# Patient Record
Sex: Male | Born: 1975 | Race: White | Hispanic: No | Marital: Single | Smoking: Current some day smoker
Health system: Southern US, Community
[De-identification: ages and names within clinical notes are randomized; demographics above are authoritative.]

## PROBLEM LIST (undated history)

## (undated) DIAGNOSIS — G4730 Sleep apnea, unspecified: Secondary | ICD-10-CM

---

## 2004-09-29 ENCOUNTER — Ambulatory Visit (HOSPITAL_BASED_OUTPATIENT_CLINIC_OR_DEPARTMENT_OTHER): Admission: RE | Admit: 2004-09-29 | Discharge: 2004-09-29 | Payer: Self-pay | Admitting: Chiropractic Medicine

## 2008-07-20 ENCOUNTER — Emergency Department (HOSPITAL_COMMUNITY): Admission: EM | Admit: 2008-07-20 | Discharge: 2008-07-20 | Payer: Self-pay | Admitting: Emergency Medicine

## 2010-09-25 ENCOUNTER — Encounter: Admission: RE | Admit: 2010-09-25 | Discharge: 2010-09-25 | Payer: Self-pay | Admitting: Geriatric Medicine

## 2010-11-12 ENCOUNTER — Ambulatory Visit (HOSPITAL_BASED_OUTPATIENT_CLINIC_OR_DEPARTMENT_OTHER)
Admission: RE | Admit: 2010-11-12 | Discharge: 2010-11-12 | Payer: Self-pay | Source: Home / Self Care | Attending: Family Medicine | Admitting: Family Medicine

## 2011-03-26 NOTE — Procedures (Signed)
NAME:  Willie Reid, Willie Reid                ACCOUNT NO.:  [id number]   MEDICAL RECORD NO.:  [id number]          PATIENT TYPE:  OUT   LOCATION:  SLEEP CENTER                 FACILITY:  [facility]   PHYSICIAN:  Clinton D. Maple Hudson, M.D. DATE OF BIRTH:  1976-10-04   DATE OF STUDY:  09/29/2004                              NOCTURNAL POLYSOMNOGRAM   REFERRING PHYSICIAN:  Dr. Albina Billet.   INDICATIONS FOR STUDY:  Hypersomnia with sleep apnea.  Epworth Sleepiness  Score of 16/24.  BMI 34.  Weight 230 pounds.   SLEEP ARCHITECTURE:  Total sleep time 412 minutes with sleep efficiency 92%.   STAGE I:  3%.   STAGE II:  52%.   STAGE III:  26%.   STAGE IV:  26%.   REM:  19% of total sleep time.   Sleep latency was 27 minutes.  REM latency was 128 minutes.  Awake after  sleep onset 7 minutes.  Arousable index mildly increased at 28.4.   RESPIRATORY DATA:  Split study protocol:  RDI 26.4 per hour, indicating  moderate obstructive sleep apnea/hypopnea syndrome before CPAP.  This  included one obstructive apnea, two central apneas, and 62 hypopneas before  CPAP.  He slept only supine until the very end of the study, and all of the  events were while supine.  REM RDI was 13.2.  CPAP was titrated to 9 CWP,  which was somewhat uncomfortable and not well tolerated.  The technician  then switched to bilateral/BIPAP using an inspiratory pressure of 13 and  expiratory pressure of 9 CWP, RDI 0 per hour with a medium Respironics full-  phase comfort mask with heated humidifier.  This was well tolerated.   OXYGEN DATA:  Moderate-to-loud snoring before CPAP with oxygen desaturation  to a nadir of 76%.  After BiPAP control, oxygen saturation held 97-98% on  room air.   CARDIAC DATA:  Normal sinus rhythm.   MOVEMENT/PARASOMNIA:  A total of 85 limb jerks were recorded, of which the  above were associated with arousal or awakening for a periodic limb movement  with an arousal index of 1.7 per hour, which is not likely  to be  significant.  There were no bathroom trips.   IMPRESSION/RECOMMENDATIONS:  Moderate obstructive sleep apnea/hypopnea  syndrome with Respiratory Disturbance Index of 26.4 per hour with  desaturation to 76%.  Titration to BiPAP I-13/E-9 CWP, RDI 0 per hour using  a medium Respironics full face comfort mask with heated humidifier.                                                           Clinton D. Maple Hudson, M.D.  Diplomate, [employer] Board   CDY/MEDQ  D:  10/04/2004 15:23:46  T:  10/04/2004 15:52:10  Job:  [id number]

## 2011-08-11 LAB — GC/CHLAMYDIA PROBE AMP, GENITAL
Chlamydia, DNA Probe: NEGATIVE
GC Probe Amp, Genital: NEGATIVE

## 2011-08-11 LAB — URINALYSIS, ROUTINE W REFLEX MICROSCOPIC
Bilirubin Urine: NEGATIVE
Nitrite: NEGATIVE
Specific Gravity, Urine: 1.023
pH: 7.5

## 2011-08-11 LAB — RPR: RPR Ser Ql: NONREACTIVE

## 2011-09-11 ENCOUNTER — Emergency Department (HOSPITAL_COMMUNITY): Payer: No Typology Code available for payment source

## 2011-09-11 ENCOUNTER — Inpatient Hospital Stay (HOSPITAL_COMMUNITY)
Admission: EM | Admit: 2011-09-11 | Discharge: 2011-09-23 | DRG: 956 | Disposition: A | Discharge status: Home / Self Care | Payer: No Typology Code available for payment source | Attending: General Surgery | Admitting: General Surgery

## 2011-09-11 ENCOUNTER — Other Ambulatory Visit: Payer: Self-pay

## 2011-09-11 DIAGNOSIS — S32009A Unspecified fracture of unspecified lumbar vertebra, initial encounter for closed fracture: Secondary | ICD-10-CM

## 2011-09-11 DIAGNOSIS — Y92410 Unspecified street and highway as the place of occurrence of the external cause: Secondary | ICD-10-CM

## 2011-09-11 DIAGNOSIS — S060X9A Concussion with loss of consciousness of unspecified duration, initial encounter: Secondary | ICD-10-CM | POA: Diagnosis present

## 2011-09-11 DIAGNOSIS — S72309A Unspecified fracture of shaft of unspecified femur, initial encounter for closed fracture: Principal | ICD-10-CM | POA: Diagnosis present

## 2011-09-11 DIAGNOSIS — S36039A Unspecified laceration of spleen, initial encounter: Secondary | ICD-10-CM

## 2011-09-11 DIAGNOSIS — Y998 Other external cause status: Secondary | ICD-10-CM

## 2011-09-11 DIAGNOSIS — E669 Obesity, unspecified: Secondary | ICD-10-CM | POA: Diagnosis present

## 2011-09-11 DIAGNOSIS — S7290XA Unspecified fracture of unspecified femur, initial encounter for closed fracture: Secondary | ICD-10-CM

## 2011-09-11 DIAGNOSIS — S32011A Stable burst fracture of first lumbar vertebra, initial encounter for closed fracture: Secondary | ICD-10-CM | POA: Diagnosis present

## 2011-09-11 DIAGNOSIS — F329 Major depressive disorder, single episode, unspecified: Secondary | ICD-10-CM | POA: Diagnosis present

## 2011-09-11 DIAGNOSIS — Z9081 Acquired absence of spleen: Secondary | ICD-10-CM

## 2011-09-11 DIAGNOSIS — S7292XA Unspecified fracture of left femur, initial encounter for closed fracture: Secondary | ICD-10-CM | POA: Diagnosis present

## 2011-09-11 DIAGNOSIS — F3289 Other specified depressive episodes: Secondary | ICD-10-CM | POA: Diagnosis present

## 2011-09-11 DIAGNOSIS — Z6836 Body mass index (BMI) 36.0-36.9, adult: Secondary | ICD-10-CM

## 2011-09-11 DIAGNOSIS — S3600XA Unspecified injury of spleen, initial encounter: Secondary | ICD-10-CM | POA: Diagnosis present

## 2011-09-11 DIAGNOSIS — K560 Paralytic ileus: Secondary | ICD-10-CM | POA: Diagnosis present

## 2011-09-11 DIAGNOSIS — R197 Diarrhea, unspecified: Secondary | ICD-10-CM | POA: Diagnosis not present

## 2011-09-11 DIAGNOSIS — N390 Urinary tract infection, site not specified: Secondary | ICD-10-CM | POA: Diagnosis not present

## 2011-09-11 DIAGNOSIS — R17 Unspecified jaundice: Secondary | ICD-10-CM | POA: Diagnosis not present

## 2011-09-11 DIAGNOSIS — Y838 Other surgical procedures as the cause of abnormal reaction of the patient, or of later complication, without mention of misadventure at the time of the procedure: Secondary | ICD-10-CM | POA: Diagnosis not present

## 2011-09-11 DIAGNOSIS — K929 Disease of digestive system, unspecified: Secondary | ICD-10-CM | POA: Diagnosis not present

## 2011-09-11 DIAGNOSIS — S27329A Contusion of lung, unspecified, initial encounter: Secondary | ICD-10-CM | POA: Diagnosis present

## 2011-09-11 DIAGNOSIS — S3681XA Injury of peritoneum, initial encounter: Secondary | ICD-10-CM | POA: Diagnosis present

## 2011-09-11 HISTORY — DX: Sleep apnea, unspecified: G47.30

## 2011-09-11 LAB — COMPREHENSIVE METABOLIC PANEL
Albumin: 3.7 g/dL (ref 3.5–5.2)
BUN: 13 mg/dL (ref 6–23)
Calcium: 8.7 mg/dL (ref 8.4–10.5)
Creatinine, Ser: 1.26 mg/dL (ref 0.50–1.35)
GFR calc Af Amer: 84 mL/min — ABNORMAL LOW (ref >90)
Total Protein: 6.4 g/dL (ref 6.0–8.3)

## 2011-09-11 LAB — URINALYSIS, ROUTINE W REFLEX MICROSCOPIC
Bilirubin Urine: NEGATIVE
Ketones, ur: NEGATIVE mg/dL
Leukocytes, UA: NEGATIVE
Nitrite: NEGATIVE
Protein, ur: 100 mg/dL — AB
Urobilinogen, UA: 0.2 mg/dL (ref 0.0–1.0)
pH: 7.5 (ref 5.0–8.0)

## 2011-09-11 LAB — RAPID URINE DRUG SCREEN, HOSP PERFORMED
Barbiturates: NOT DETECTED
Benzodiazepines: NOT DETECTED
Cocaine: NOT DETECTED
Opiates: NOT DETECTED
Tetrahydrocannabinol: POSITIVE — AB

## 2011-09-11 LAB — CBC
MCH: 31 pg (ref 26.0–34.0)
MCHC: 35.9 g/dL (ref 30.0–36.0)
Platelets: 185 10*3/uL (ref 150–400)
RBC: 4.97 MIL/uL (ref 4.22–5.81)
RDW: 14 % (ref 11.5–15.5)

## 2011-09-11 LAB — POCT I-STAT, CHEM 8
Calcium, Ion: 1.08 mmol/L — ABNORMAL LOW (ref 1.12–1.32)
Creatinine, Ser: 1.4 mg/dL — ABNORMAL HIGH (ref 0.50–1.35)
Glucose, Bld: 135 mg/dL — ABNORMAL HIGH (ref 70–99)
Hemoglobin: 16 g/dL (ref 13.0–17.0)
Sodium: 139 mEq/L (ref 135–145)
TCO2: 20 mmol/L (ref 0–100)

## 2011-09-11 LAB — URINE MICROSCOPIC-ADD ON

## 2011-09-11 LAB — PROTIME-INR: Prothrombin Time: 16.2 seconds — ABNORMAL HIGH (ref 11.6–15.2)

## 2011-09-11 MED ORDER — DEXTROSE-NACL 5-0.9 % IV SOLN
INTRAVENOUS | Status: DC
  Administered 2011-09-12 – 2011-09-16 (×10): via INTRAVENOUS
  Administered 2011-09-17: 990 mL via INTRAVENOUS

## 2011-09-11 MED ORDER — IOHEXOL 300 MG/ML  SOLN
100.0000 mL | Freq: Once | INTRAMUSCULAR | Status: AC | PRN
  Administered 2011-09-11: 100 mL via INTRAVENOUS

## 2011-09-11 MED ORDER — ONDANSETRON HCL 4 MG/2ML IJ SOLN: 4.0000 mg | Freq: Four times a day (QID) | INTRAMUSCULAR | Status: DC | PRN

## 2011-09-11 MED ORDER — PANTOPRAZOLE SODIUM 40 MG IV SOLR
40.0000 mg | Freq: Every day | INTRAVENOUS | Status: DC
  Administered 2011-09-12 – 2011-09-15 (×3): 40 mg via INTRAVENOUS
  Filled 2011-09-11 (×4): qty 40

## 2011-09-12 ENCOUNTER — Inpatient Hospital Stay (HOSPITAL_COMMUNITY): Payer: No Typology Code available for payment source | Admitting: Anesthesiology

## 2011-09-12 ENCOUNTER — Encounter (HOSPITAL_COMMUNITY): Payer: Self-pay | Admitting: *Deleted

## 2011-09-12 ENCOUNTER — Inpatient Hospital Stay (HOSPITAL_COMMUNITY): Payer: No Typology Code available for payment source

## 2011-09-12 ENCOUNTER — Encounter (HOSPITAL_COMMUNITY): Payer: Self-pay | Admitting: Anesthesiology

## 2011-09-12 ENCOUNTER — Encounter (HOSPITAL_COMMUNITY): Admission: EM | Disposition: A | Discharge status: Home / Self Care | Payer: Self-pay | Source: Home / Self Care

## 2011-09-12 ENCOUNTER — Other Ambulatory Visit (INDEPENDENT_AMBULATORY_CARE_PROVIDER_SITE_OTHER): Payer: Self-pay | Admitting: General Surgery

## 2011-09-12 DIAGNOSIS — S060X9A Concussion with loss of consciousness of unspecified duration, initial encounter: Secondary | ICD-10-CM | POA: Diagnosis present

## 2011-09-12 DIAGNOSIS — S7292XA Unspecified fracture of left femur, initial encounter for closed fracture: Secondary | ICD-10-CM | POA: Diagnosis present

## 2011-09-12 DIAGNOSIS — S32011A Stable burst fracture of first lumbar vertebra, initial encounter for closed fracture: Secondary | ICD-10-CM | POA: Diagnosis present

## 2011-09-12 DIAGNOSIS — S36039A Unspecified laceration of spleen, initial encounter: Secondary | ICD-10-CM | POA: Diagnosis present

## 2011-09-12 DIAGNOSIS — J9600 Acute respiratory failure, unspecified whether with hypoxia or hypercapnia: Secondary | ICD-10-CM

## 2011-09-12 HISTORY — PX: SPLENECTOMY, TOTAL: SHX788

## 2011-09-12 HISTORY — PX: LAPAROTOMY: SHX154

## 2011-09-12 LAB — CBC
Hemoglobin: 13.9 g/dL (ref 13.0–17.0)
MCH: 30.1 pg (ref 26.0–34.0)
Platelets: 103 10*3/uL — ABNORMAL LOW (ref 150–400)
RBC: 4.62 MIL/uL (ref 4.22–5.81)
WBC: 8.5 10*3/uL (ref 4.0–10.5)

## 2011-09-12 LAB — COMPREHENSIVE METABOLIC PANEL
ALT: 64 U/L — ABNORMAL HIGH (ref 0–53)
AST: 160 U/L — ABNORMAL HIGH (ref 0–37)
Alkaline Phosphatase: 30 U/L — ABNORMAL LOW (ref 39–117)
CO2: 21 mEq/L (ref 19–32)
Calcium: 6.5 mg/dL — ABNORMAL LOW (ref 8.4–10.5)
GFR calc non Af Amer: >90 mL/min (ref >90)
Potassium: 3.9 mEq/L (ref 3.5–5.1)
Sodium: 136 mEq/L (ref 135–145)

## 2011-09-12 LAB — ABO/RH: ABO/RH(D): O POS

## 2011-09-12 LAB — HEMOGLOBIN AND HEMATOCRIT, BLOOD
HCT: 32.8 % — ABNORMAL LOW (ref 39.0–52.0)
Hemoglobin: 11.4 g/dL — ABNORMAL LOW (ref 13.0–17.0)

## 2011-09-12 LAB — MRSA PCR SCREENING: MRSA by PCR: NEGATIVE

## 2011-09-12 SURGERY — LAPAROTOMY, EXPLORATORY
Anesthesia: General | Site: Abdomen | Wound class: Clean

## 2011-09-12 MED ORDER — MORPHINE SULFATE 2 MG/ML IJ SOLN
2.0000 mg | Freq: Once | INTRAMUSCULAR | Status: AC
  Administered 2011-09-11: 2 mg via INTRAVENOUS

## 2011-09-12 MED ORDER — HYDROMORPHONE 0.3 MG/ML IV SOLN: INTRAVENOUS | Status: DC

## 2011-09-12 MED ORDER — POVIDONE-IODINE 10 % EX OINT
TOPICAL_OINTMENT | CUTANEOUS | Status: DC | PRN
  Administered 2011-09-12: 1 via TOPICAL

## 2011-09-12 MED ORDER — CEFAZOLIN SODIUM 1-5 GM-% IV SOLN
1.0000 g | Freq: Three times a day (TID) | INTRAVENOUS | Status: AC
Start: 2011-09-12 — End: 2011-09-13
  Administered 2011-09-12 (×2): 1 g via INTRAVENOUS
  Filled 2011-09-12 (×3): qty 50

## 2011-09-12 MED ORDER — DIPHENHYDRAMINE HCL 50 MG/ML IJ SOLN: 12.5000 mg | Freq: Four times a day (QID) | INTRAMUSCULAR | Status: DC | PRN

## 2011-09-12 MED ORDER — DEXAMETHASONE SODIUM PHOSPHATE 4 MG/ML IJ SOLN
INTRAMUSCULAR | Status: DC | PRN
  Administered 2011-09-12: 4 mg via INTRAVENOUS

## 2011-09-12 MED ORDER — GLYCOPYRROLATE 0.2 MG/ML IJ SOLN
INTRAMUSCULAR | Status: DC | PRN
  Administered 2011-09-12: 0.1 mg via INTRAVENOUS
  Administered 2011-09-12: .06 mg via INTRAVENOUS

## 2011-09-12 MED ORDER — CEFAZOLIN SODIUM 1-5 GM-% IV SOLN
INTRAVENOUS | Status: DC | PRN
  Administered 2011-09-12: 2 g via INTRAVENOUS

## 2011-09-12 MED ORDER — PROMETHAZINE HCL 25 MG/ML IJ SOLN: 6.2500 mg | INTRAMUSCULAR | Status: DC | PRN

## 2011-09-12 MED ORDER — SUCCINYLCHOLINE CHLORIDE 20 MG/ML IJ SOLN
INTRAMUSCULAR | Status: DC | PRN
  Administered 2011-09-12: 140 mg via INTRAVENOUS

## 2011-09-12 MED ORDER — WHITE PETROLATUM GEL
Status: AC
  Administered 2011-09-12: 21:00:00
  Filled 2011-09-12: qty 5

## 2011-09-12 MED ORDER — CEFAZOLIN SODIUM 1 G IJ SOLR
2.0000 g | INTRAMUSCULAR | Status: DC
  Filled 2011-09-12: qty 20

## 2011-09-12 MED ORDER — ONDANSETRON HCL 4 MG/2ML IJ SOLN
INTRAMUSCULAR | Status: DC | PRN
  Administered 2011-09-12: 4 mg via INTRAVENOUS

## 2011-09-12 MED ORDER — HYDROMORPHONE HCL PF 1 MG/ML IJ SOLN: 0.2500 mg | INTRAMUSCULAR | Status: DC | PRN

## 2011-09-12 MED ORDER — LACTATED RINGERS IV SOLN
INTRAVENOUS | Status: DC | PRN
  Administered 2011-09-12: 11:00:00 via INTRAVENOUS

## 2011-09-12 MED ORDER — NALOXONE HCL 0.4 MG/ML IJ SOLN: 0.4000 mg | INTRAMUSCULAR | Status: DC | PRN

## 2011-09-12 MED ORDER — LORAZEPAM 2 MG/ML IJ SOLN: 1.0000 mg | Freq: Once | INTRAMUSCULAR | Status: DC | PRN

## 2011-09-12 MED ORDER — BIOTENE DRY MOUTH MT LIQD
15.0000 mL | Freq: Two times a day (BID) | OROMUCOSAL | Status: DC
  Administered 2011-09-13 – 2011-09-16 (×8): 15 mL via OROMUCOSAL

## 2011-09-12 MED ORDER — NEOSTIGMINE METHYLSULFATE 1 MG/ML IJ SOLN
INTRAMUSCULAR | Status: DC | PRN
  Administered 2011-09-12: 4 mg via INTRAVENOUS

## 2011-09-12 MED ORDER — DIPHENHYDRAMINE HCL 12.5 MG/5ML PO ELIX
12.5000 mg | ORAL_SOLUTION | Freq: Four times a day (QID) | ORAL | Status: DC | PRN
  Filled 2011-09-12: qty 5

## 2011-09-12 MED ORDER — LIDOCAINE HCL (CARDIAC) 20 MG/ML IV SOLN
INTRAVENOUS | Status: DC | PRN
  Administered 2011-09-12: 50 mg via INTRAVENOUS

## 2011-09-12 MED ORDER — MORPHINE SULFATE 4 MG/ML IJ SOLN
INTRAMUSCULAR | Status: AC
  Filled 2011-09-12: qty 1

## 2011-09-12 MED ORDER — PROPOFOL 10 MG/ML IV EMUL
INTRAVENOUS | Status: DC | PRN
  Administered 2011-09-12: 150 mg via INTRAVENOUS

## 2011-09-12 MED ORDER — ONDANSETRON HCL 4 MG/2ML IJ SOLN: 4.0000 mg | Freq: Four times a day (QID) | INTRAMUSCULAR | Status: DC | PRN

## 2011-09-12 MED ORDER — HETASTARCH-ELECTROLYTES 6 % IV SOLN
INTRAVENOUS | Status: DC | PRN
  Administered 2011-09-12: 11:00:00 via INTRAVENOUS

## 2011-09-12 MED ORDER — VECURONIUM BROMIDE 10 MG IV SOLR
INTRAVENOUS | Status: DC | PRN
  Administered 2011-09-12: 5 mg via INTRAVENOUS
  Administered 2011-09-12: 1 mg via INTRAVENOUS

## 2011-09-12 MED ORDER — FENTANYL CITRATE 0.05 MG/ML IJ SOLN
INTRAMUSCULAR | Status: DC | PRN
  Administered 2011-09-12 (×2): 100 ug via INTRAVENOUS
  Administered 2011-09-12 (×4): 50 ug via INTRAVENOUS

## 2011-09-12 MED ORDER — SODIUM CHLORIDE 0.9 % IR SOLN
Status: DC | PRN
  Administered 2011-09-12: 6000 mL

## 2011-09-12 MED ORDER — MORPHINE SULFATE 2 MG/ML IJ SOLN: 1.0000 mg | INTRAMUSCULAR | Status: DC | PRN

## 2011-09-12 MED ORDER — CHLORHEXIDINE GLUCONATE 0.12 % MT SOLN
15.0000 mL | Freq: Two times a day (BID) | OROMUCOSAL | Status: DC
  Administered 2011-09-12 – 2011-09-17 (×9): 15 mL via OROMUCOSAL
  Filled 2011-09-12 (×16): qty 15

## 2011-09-12 MED ORDER — SODIUM CHLORIDE 0.9 % IJ SOLN: 9.0000 mL | INTRAMUSCULAR | Status: DC | PRN

## 2011-09-12 MED ORDER — ONDANSETRON HCL 4 MG/2ML IJ SOLN
4.0000 mg | Freq: Four times a day (QID) | INTRAMUSCULAR | Status: DC | PRN
  Administered 2011-09-15 – 2011-09-18 (×3): 4 mg via INTRAVENOUS
  Filled 2011-09-12: qty 2

## 2011-09-12 MED ORDER — SODIUM CHLORIDE 0.9 % IV SOLN
Freq: Once | INTRAVENOUS | Status: AC
  Administered 2011-09-12: 01:00:00 via INTRAVENOUS

## 2011-09-12 MED ORDER — IOHEXOL 300 MG/ML  SOLN
80.0000 mL | Freq: Once | INTRAMUSCULAR | Status: AC | PRN
  Administered 2011-09-12: 80 mL via INTRAVENOUS

## 2011-09-12 MED ORDER — MEPERIDINE HCL 25 MG/ML IJ SOLN: 6.2500 mg | INTRAMUSCULAR | Status: DC | PRN

## 2011-09-12 SURGICAL SUPPLY — 39 items
BLADE SURG ROTATE 9660 (MISCELLANEOUS) IMPLANT
CANISTER SUCTION 2500CC (MISCELLANEOUS) IMPLANT
CHLORAPREP W/TINT 26ML (MISCELLANEOUS) ×3 IMPLANT
CLOTH BEACON ORANGE TIMEOUT ST (SAFETY) ×3 IMPLANT
COVER SURGICAL LIGHT HANDLE (MISCELLANEOUS) ×6 IMPLANT
DRAPE LAPAROSCOPIC ABDOMINAL (DRAPES) ×3 IMPLANT
DRAPE UTILITY 15X26 W/TAPE STR (DRAPE) ×6 IMPLANT
DRAPE WARM FLUID 44X44 (DRAPE) ×3 IMPLANT
ELECT BLADE 6.5 EXT (BLADE) IMPLANT
ELECT REM PT RETURN 9FT ADLT (ELECTROSURGICAL) ×3
ELECTRODE REM PT RTRN 9FT ADLT (ELECTROSURGICAL) ×2 IMPLANT
GAUZE SPONGE 4X4 12PLY STRL LF (GAUZE/BANDAGES/DRESSINGS) ×3 IMPLANT
GLOVE BIOGEL PI IND STRL 8 (GLOVE) ×4 IMPLANT
GLOVE BIOGEL PI INDICATOR 8 (GLOVE) ×2
GLOVE ECLIPSE 7.5 STRL STRAW (GLOVE) ×3 IMPLANT
GOWN STRL NON-REIN LRG LVL3 (GOWN DISPOSABLE) ×9 IMPLANT
KIT BASIN OR (CUSTOM PROCEDURE TRAY) ×3 IMPLANT
KIT ROOM TURNOVER OR (KITS) ×3 IMPLANT
LIGASURE IMPACT 36 18CM CVD LR (INSTRUMENTS) ×3 IMPLANT
NS IRRIG 1000ML POUR BTL (IV SOLUTION) ×18 IMPLANT
PACK GENERAL/GYN (CUSTOM PROCEDURE TRAY) ×3 IMPLANT
PAD ARMBOARD 7.5X6 YLW CONV (MISCELLANEOUS) ×3 IMPLANT
SPECIMEN JAR LARGE (MISCELLANEOUS) ×3 IMPLANT
SPECIMEN JAR X LARGE (MISCELLANEOUS) IMPLANT
SPONGE GAUZE 4X4 12PLY (GAUZE/BANDAGES/DRESSINGS) ×3 IMPLANT
SPONGE LAP 18X18 X RAY DECT (DISPOSABLE) ×6 IMPLANT
STAPLER VISISTAT 35W (STAPLE) ×3 IMPLANT
SUCTION POOLE TIP (SUCTIONS) ×3 IMPLANT
SUT PDS AB 1 TP1 96 (SUTURE) ×6 IMPLANT
SUT SILK 2 0 SH CR/8 (SUTURE) ×3 IMPLANT
SUT SILK 2 0 TIES 10X30 (SUTURE) ×3 IMPLANT
SUT SILK 3 0 SH CR/8 (SUTURE) ×3 IMPLANT
SUT SILK 3 0 TIES 10X30 (SUTURE) ×3 IMPLANT
TAPE CLOTH SURG 4X10 WHT LF (GAUZE/BANDAGES/DRESSINGS) ×3 IMPLANT
TOWEL OR 17X24 6PK STRL BLUE (TOWEL DISPOSABLE) ×3 IMPLANT
TOWEL OR 17X26 10 PK STRL BLUE (TOWEL DISPOSABLE) ×3 IMPLANT
TRAY FOLEY CATH 14FRSI W/METER (CATHETERS) IMPLANT
WATER STERILE IRR 1000ML POUR (IV SOLUTION) ×3 IMPLANT
YANKAUER SUCT BULB TIP NO VENT (SUCTIONS) ×3 IMPLANT

## 2011-09-12 NOTE — Anesthesia Procedure Notes (Addendum)
Procedure Name: Intubation Date/Time: 09/12/2011 10:47 AM Performed by: Elizbeth Squires Patient Re-evaluated:Patient Re-evaluated prior to inductionOxygen Delivery Method: Circle System Utilized Preoxygenation: Pre-oxygenation with 100% oxygen Intubation Type: IV induction, Rapid sequence and Circoid Pressure applied Laryngoscope Size: Mac and 3 Grade View: Grade I Tube size: 8.0 mm Number of attempts: 1 Airway Equipment and Method: stylet Placement Confirmation: ETT inserted through vocal cords under direct vision,  positive ETCO2 and breath sounds checked- equal and bilateral Secured at: 23 cm Tube secured with: Tape Dental Injury: Teeth and Oropharynx as per pre-operative assessment  Comments: Lacerated area noted right tongue, some blood in back of airway with DVL

## 2011-09-12 NOTE — Progress Notes (Signed)
    Subjective: Pain is mild.  No c/o chest pain or SOB.   Still confused/sleepy.  No new pain complaints. Comfortable in skeletal traction.  Has no received 4 units pRBCs.    Objective: Vital signs in last 24 hours: Temp:  [98 F (36.7 C)-98.5 F (36.9 C)] 98 F (36.7 C) (11/04 0545) Pulse Rate:  [91-101] 98  (11/04 0600) Resp:  [19-25] 25  (11/04 0600) BP: (80-109)/(33-63) 104/49 mmHg (11/04 0600) SpO2:  [98 %-100 %] 99 % (11/04 0600) Weight:  [104.8 kg (231 lb 0.7 oz)] 231 lb 0.7 oz (104.8 kg) (11/03 2300)  Intake/Output from previous day: 11/03 0701 - 11/04 0700 In: 1467.5 [I.V.:1117.5; Blood:350] Out: 1125 [Urine:1125] Intake/Output this shift: Total I/O In: 1467.5 [I.V.:1117.5; Blood:350] Out: 1125 [Urine:1125]   Basename 09/12/11 0119 09/11/11 2036 09/11/11 2024  HGB 11.4* 16.0 15.4    Basename 09/12/11 0119 09/11/11 2036 09/11/11 2024  WBC -- -- 16.0*  RBC -- -- 4.97  HCT 32.8* 47.0 --  PLT -- -- 185    Basename 09/11/11 2036 09/11/11 2024  NA 139 137  K 3.7 3.6  CL 106 104  CO2 -- 22  BUN 13 13  CREATININE 1.40* 1.26  GLUCOSE 135* 137*  CALCIUM -- 8.7    Basename 09/11/11 2024  LABPT --  INR 1.27    Sensation intact distally LLE Intact pulses distally LLE Dorsiflexion/Plantar flexion intact L foot Compartment soft L thigh  Assessment/Plan:   Will d/w primary team re: timing of fixation of L femur fx.  Once cleared for OR by neurosurgery and trauma surgery, will take for IMN  L femur.  NPO. Cont bedrest and skeletal traction.     Willie Reid 09/12/2011, 6:44 AM
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
## 2011-09-12 NOTE — Anesthesia Postprocedure Evaluation (Signed)
  Anesthesia Post-op Note  Patient: Willie Reid  Procedure(s) Performed:  EXPLORATORY LAPAROTOMY; SPLENECTOMY  Patient Location: PACU  Anesthesia Type: General  Level of Consciousness: confused and responds to stimulation  Airway and Oxygen Therapy: Patient Spontanous Breathing and Patient connected to face mask oxygen  Post-op Pain: none  Post-op Assessment: Post-op Vital signs reviewed, Patient's Cardiovascular Status Stable, Respiratory Function Stable, Patent Airway, No signs of Nausea or vomiting and Pain level controlled  Post-op Vital Signs: stable  Complications: No apparent anesthesia complications

## 2011-09-12 NOTE — Progress Notes (Signed)
  Operative report  Preoperative diagnoses:  Hemoperitoneum with hypotension and grade 2 splenic laceration  Postoperative diagnosis:  Same with an additional mesenteric laceration  Procedure:  #1 splenectomy; #2 repair of mesenteric laceration  Surgeon:  Abbe Amsterdam  Anesthesia:  Gen. Endotracheal  Estimated blood loss:  1600 cc  Specimen:  Spleen  Complications:  None  Condition:  Fair, stable  Disposition:  To PACU then to ICU  Fluids:  1000 cc of Hextend, 1800 cc of lactated Ringer's, 850 cc of Cell Saver blood  Urine output:  150 cc  Marta Lamas. Gae Bon, MD, FACS [phone] [facility]
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
## 2011-09-12 NOTE — Anesthesia Preprocedure Evaluation (Addendum)
Anesthesia Evaluation  Patient identified by MRN, date of birth, ID band Patient confused    Reviewed: Allergy & Precautions, H&P , NPO status , Patient's Chart, lab work & pertinent test results  Airway Mallampati: II TM Distance: >3 FB     Dental   Pulmonary  clear to auscultation        Cardiovascular Regular Tachycardia    Neuro/Psych PSYCHIATRIC DISORDERS Anxiety Depression    GI/Hepatic   Endo/Other    Renal/GU      Musculoskeletal   Abdominal (+) obese,   Peds  Hematology   Anesthesia Other Findings   Reproductive/Obstetrics                         Anesthesia Physical Anesthesia Plan  ASA: II and Emergent  Anesthesia Plan: General   Post-op Pain Management:    Induction: Intravenous, Rapid sequence and Cricoid pressure planned  Airway Management Planned: Oral ETT  Additional Equipment:   Intra-op Plan:   Post-operative Plan: Extubation in OR  Informed Consent: I have reviewed the patients History and Physical, chart, labs and discussed the procedure including the risks, benefits and alternatives for the proposed anesthesia with the patient or authorized representative who has indicated his/her understanding and acceptance.   History available from chart only and Dental advisory given  Plan Discussed with: CRNA, Surgeon and Anesthesiologist  Anesthesia Plan Comments:       Anesthesia Quick Evaluation
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
## 2011-09-12 NOTE — Progress Notes (Signed)
The patient has continuing bleeding from his spleen.  Extravasation seen on initial CT note officially called by radiology.  Will go to OR today/ASAP

## 2011-09-12 NOTE — Transfer of Care (Signed)
Immediate Anesthesia Transfer of Care Note  Patient: Willie Reid   Procedure(s) Performed:  EXPLORATORY LAPAROTOMY; SPLENECTOMY  Patient Location: PACU  Anesthesia Type: General  Level of Consciousness: sedated  Airway & Oxygen Therapy: Patient Spontanous Breathing and Patient connected to face mask oxygen  Post-op Assessment: Report given to PACU RN and Post -op Vital signs reviewed and stable  Post vital signs: stable  Complications: No apparent anesthesia complications

## 2011-09-12 NOTE — Op Note (Signed)
Operative report  Preoperative diagnoses:  Hemoperitoneum with Grade II splenic laceration, hypotension  Postoperative diagnosis:  Same with mesenteric laceration  Procedure:  Exploratory laparotomy, splenectomy, repair of mesenteric laceration  Surgeon:  Gabriel Carina  Anesthesia:  Gen. Endotracheal  Estimated blood loss:  1600 cc  Specimen:  Spleen  Complications:  None  Condition:  Fair but stable  Disposition:  To PACU then to 2100 ICU  Fluids:  1800cc crystalloid, 1000cc hextend, 850cc Cellsaver blood  Urine output:  150cc  Marta Lamas. Gae Bon, MD, FACS [phone] [facility]
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
## 2011-09-13 ENCOUNTER — Inpatient Hospital Stay (HOSPITAL_COMMUNITY): Payer: No Typology Code available for payment source | Admitting: Anesthesiology

## 2011-09-13 ENCOUNTER — Encounter (HOSPITAL_COMMUNITY): Admission: EM | Disposition: A | Discharge status: Home / Self Care | Payer: Self-pay | Source: Home / Self Care

## 2011-09-13 ENCOUNTER — Encounter (HOSPITAL_COMMUNITY): Payer: Self-pay | Admitting: Anesthesiology

## 2011-09-13 ENCOUNTER — Inpatient Hospital Stay (HOSPITAL_COMMUNITY): Payer: No Typology Code available for payment source

## 2011-09-13 HISTORY — PX: FEMUR IM NAIL: SHX1597

## 2011-09-13 LAB — CBC
Hemoglobin: 13.6 g/dL (ref 13.0–17.0)
MCH: 30.3 pg (ref 26.0–34.0)
MCHC: 35.9 g/dL (ref 30.0–36.0)
MCV: 84.4 fL (ref 78.0–100.0)
Platelets: 104 10*3/uL — ABNORMAL LOW (ref 150–400)

## 2011-09-13 LAB — BASIC METABOLIC PANEL
BUN: 8 mg/dL (ref 6–23)
Calcium: 7.2 mg/dL — ABNORMAL LOW (ref 8.4–10.5)
GFR calc Af Amer: >90 mL/min (ref >90)
GFR calc non Af Amer: >90 mL/min (ref >90)
Potassium: 3.6 mEq/L (ref 3.5–5.1)

## 2011-09-13 LAB — DIFFERENTIAL
Basophils Relative: 0 % (ref 0–1)
Eosinophils Absolute: 0 10*3/uL (ref 0.0–0.7)
Lymphs Abs: 1.1 10*3/uL (ref 0.7–4.0)
Monocytes Absolute: 1.2 10*3/uL — ABNORMAL HIGH (ref 0.1–1.0)
Neutrophils Relative %: 83 % — ABNORMAL HIGH (ref 43–77)

## 2011-09-13 SURGERY — INSERTION, INTRAMEDULLARY ROD, FEMUR
Anesthesia: General | Laterality: Left

## 2011-09-13 MED ORDER — PROPOFOL 10 MG/ML IV EMUL
INTRAVENOUS | Status: DC | PRN
  Administered 2011-09-13: 50 mg via INTRAVENOUS
  Administered 2011-09-13: 200 mg via INTRAVENOUS

## 2011-09-13 MED ORDER — SODIUM CHLORIDE 0.9 % IR SOLN
Status: DC | PRN
  Administered 2011-09-13: 1

## 2011-09-13 MED ORDER — NEOSTIGMINE METHYLSULFATE 1 MG/ML IJ SOLN
INTRAMUSCULAR | Status: DC | PRN
  Administered 2011-09-13: 4 mg via INTRAVENOUS

## 2011-09-13 MED ORDER — GLYCOPYRROLATE 0.2 MG/ML IJ SOLN
INTRAMUSCULAR | Status: DC | PRN
  Administered 2011-09-13: .8 mg via INTRAVENOUS

## 2011-09-13 MED ORDER — METOPROLOL TARTRATE 1 MG/ML IV SOLN
5.0000 mg | Freq: Four times a day (QID) | INTRAVENOUS | Status: DC
  Administered 2011-09-13 – 2011-09-21 (×30): 5 mg via INTRAVENOUS
  Filled 2011-09-13 (×35): qty 5

## 2011-09-13 MED ORDER — HYDROMORPHONE HCL PF 1 MG/ML IJ SOLN: 0.2500 mg | INTRAMUSCULAR | Status: DC | PRN

## 2011-09-13 MED ORDER — LABETALOL HCL 5 MG/ML IV SOLN
INTRAVENOUS | Status: DC | PRN
  Administered 2011-09-13 (×2): 10 mg via INTRAVENOUS

## 2011-09-13 MED ORDER — MORPHINE SULFATE 4 MG/ML IJ SOLN
1.0000 mg | INTRAMUSCULAR | Status: DC | PRN
  Administered 2011-09-13 (×2): 2 mg via INTRAVENOUS
  Administered 2011-09-13 – 2011-09-15 (×5): 4 mg via INTRAVENOUS
  Administered 2011-09-16: 1 mg via INTRAVENOUS
  Administered 2011-09-16: 4 mg via INTRAVENOUS
  Administered 2011-09-17: 2 mg via INTRAVENOUS
  Filled 2011-09-13 (×9): qty 1

## 2011-09-13 MED ORDER — CEFAZOLIN SODIUM 1-5 GM-% IV SOLN
1.0000 g | Freq: Four times a day (QID) | INTRAVENOUS | Status: AC
  Administered 2011-09-14 (×2): 1 g via INTRAVENOUS
  Filled 2011-09-13 (×3): qty 50

## 2011-09-13 MED ORDER — FENTANYL CITRATE 0.05 MG/ML IJ SOLN
INTRAMUSCULAR | Status: DC | PRN
  Administered 2011-09-13 (×2): 50 ug via INTRAVENOUS
  Administered 2011-09-13: 150 ug via INTRAVENOUS
  Administered 2011-09-13 (×2): 100 ug via INTRAVENOUS
  Administered 2011-09-13: 150 ug via INTRAVENOUS

## 2011-09-13 MED ORDER — LACTATED RINGERS IV SOLN
INTRAVENOUS | Status: DC | PRN
  Administered 2011-09-13 (×2): via INTRAVENOUS

## 2011-09-13 MED ORDER — CEFAZOLIN SODIUM 1-5 GM-% IV SOLN
INTRAVENOUS | Status: DC | PRN
  Administered 2011-09-13: 1 g via INTRAVENOUS

## 2011-09-13 MED ORDER — ONDANSETRON HCL 4 MG/2ML IJ SOLN
INTRAMUSCULAR | Status: DC | PRN
  Administered 2011-09-13 (×2): 4 mg via INTRAVENOUS

## 2011-09-13 MED ORDER — SODIUM CHLORIDE 0.9 % IJ SOLN
3.0000 mL | Freq: Two times a day (BID) | INTRAMUSCULAR | Status: DC
  Administered 2011-09-13 – 2011-09-16 (×5): 3 mL via INTRAVENOUS

## 2011-09-13 MED ORDER — SUCCINYLCHOLINE CHLORIDE 20 MG/ML IJ SOLN
INTRAMUSCULAR | Status: DC | PRN
  Administered 2011-09-13: 120 mg via INTRAVENOUS

## 2011-09-13 MED ORDER — MORPHINE SULFATE 4 MG/ML IJ SOLN
INTRAMUSCULAR | Status: AC
  Administered 2011-09-13: 2 mg via INTRAVENOUS
  Filled 2011-09-13: qty 18

## 2011-09-13 MED ORDER — MORPHINE SULFATE 2 MG/ML IJ SOLN: 1.0000 mg | INTRAMUSCULAR | Status: DC | PRN

## 2011-09-13 MED ORDER — ROCURONIUM BROMIDE 100 MG/10ML IV SOLN
INTRAVENOUS | Status: DC | PRN
  Administered 2011-09-13: 20 mg via INTRAVENOUS
  Administered 2011-09-13: 50 mg via INTRAVENOUS

## 2011-09-13 MED ORDER — CEFAZOLIN SODIUM 1-5 GM-% IV SOLN
1.0000 g | Freq: Once | INTRAVENOUS | Status: AC
  Administered 2011-09-13: 1 g via INTRAVENOUS
  Filled 2011-09-13: qty 50

## 2011-09-13 MED FILL — Cefazolin in D5W Inj 1 GM/50ML: INTRAVENOUS | Qty: 50 | Status: AC

## 2011-09-13 SURGICAL SUPPLY — 44 items
BANDAGE GAUZE 4  KLING STR (GAUZE/BANDAGES/DRESSINGS) ×2 IMPLANT
BANDAGE GAUZE ELAST BULKY 4 IN (GAUZE/BANDAGES/DRESSINGS) ×2 IMPLANT
BIT DRILL 3.8X8 NS (BIT) ×2 IMPLANT
BIT DRILL 5.3 (BIT) ×2 IMPLANT
BNDG COHESIVE 4X5 TAN STRL (GAUZE/BANDAGES/DRESSINGS) ×2 IMPLANT
CHLORAPREP W/TINT 26ML (MISCELLANEOUS) ×2 IMPLANT
CLOTH BEACON ORANGE TIMEOUT ST (SAFETY) ×2 IMPLANT
COVER MAYO STAND STRL (DRAPES) ×4 IMPLANT
COVER SURGICAL LIGHT HANDLE (MISCELLANEOUS) ×2 IMPLANT
DRAPE C-ARM 42X72 X-RAY (DRAPES) ×2 IMPLANT
DRAPE INCISE IOBAN 66X45 STRL (DRAPES) ×2 IMPLANT
DRAPE STERI IOBAN 125X83 (DRAPES) IMPLANT
DRAPE SURG 17X23 STRL (DRAPES) ×8 IMPLANT
DRSG MEPILEX BORDER 4X4 (GAUZE/BANDAGES/DRESSINGS) ×4 IMPLANT
DRSG MEPILEX BORDER 4X8 (GAUZE/BANDAGES/DRESSINGS) ×2 IMPLANT
DRSG PAD ABDOMINAL 8X10 ST (GAUZE/BANDAGES/DRESSINGS) ×2 IMPLANT
ELECT REM PT RETURN 9FT ADLT (ELECTROSURGICAL) ×2
ELECTRODE REM PT RTRN 9FT ADLT (ELECTROSURGICAL) ×1 IMPLANT
GLOVE BIO SURGEON STRL SZ7.5 (GLOVE) ×4 IMPLANT
GLOVE BIOGEL PI IND STRL 8 (GLOVE) ×2 IMPLANT
GLOVE BIOGEL PI INDICATOR 8 (GLOVE) ×2
GOWN STRL NON-REIN LRG LVL3 (GOWN DISPOSABLE) ×2 IMPLANT
GUIDEPIN 3.2X17.5 THRD DISP (PIN) ×2 IMPLANT
GUIDEWIRE BALL NOSE 80CM (WIRE) ×2 IMPLANT
KIT ROOM TURNOVER OR (KITS) ×2 IMPLANT
NAIL ENTRY TROCH 11X40MM (Nail) ×2 IMPLANT
NS IRRIG 1000ML POUR BTL (IV SOLUTION) ×2 IMPLANT
PACK GENERAL/GYN (CUSTOM PROCEDURE TRAY) ×2 IMPLANT
PAD ARMBOARD 7.5X6 YLW CONV (MISCELLANEOUS) ×4 IMPLANT
SCREW ACE CAP BN (Screw) ×1 IMPLANT
SCREW ACE CORTICAL 6.5X70MML (Screw) ×2 IMPLANT
SCREW ACECAP 50MM (Screw) ×2 IMPLANT
SCREW BN FT 58X4.5XSLD CORT (Screw) ×1 IMPLANT
SCREWDRIVER HEX TIP 3.5MM (MISCELLANEOUS) ×2 IMPLANT
SLEEVE SURGEON STRL (DRAPES) ×2 IMPLANT
SPONGE GAUZE 4X4 FOR O.R. (GAUZE/BANDAGES/DRESSINGS) ×2 IMPLANT
STAPLER VISISTAT 35W (STAPLE) ×2 IMPLANT
SUT VIC AB 1 CT1 27 (SUTURE) ×1
SUT VIC AB 1 CT1 27XBRD ANBCTR (SUTURE) ×1 IMPLANT
SUT VIC AB 2-0 CT1 27 (SUTURE) ×1
SUT VIC AB 2-0 CT1 TAPERPNT 27 (SUTURE) ×1 IMPLANT
TOWEL OR 17X24 6PK STRL BLUE (TOWEL DISPOSABLE) ×2 IMPLANT
TOWEL OR 17X26 10 PK STRL BLUE (TOWEL DISPOSABLE) ×2 IMPLANT
WATER STERILE IRR 1000ML POUR (IV SOLUTION) ×2 IMPLANT

## 2011-09-13 NOTE — Anesthesia Procedure Notes (Addendum)
Procedure Name: Intubation Date/Time: 09/13/2011 5:04 PM Performed by: Fanny Dance Pre-anesthesia Checklist: Patient identified, Emergency Drugs available, Suction available and Patient being monitored Patient Re-evaluated:Patient Re-evaluated prior to inductionOxygen Delivery Method: Circle System Utilized Preoxygenation: Pre-oxygenation with 100% oxygen Intubation Type: IV induction, Rapid sequence and Circoid Pressure applied Laryngoscope Size: Miller and 2 Grade View: Grade II Tube type: Oral Tube size: 7.5 mm Number of attempts: 1 Airway Equipment and Method: stylet Placement Confirmation: ETT inserted through vocal cords under direct vision,  positive ETCO2 and breath sounds checked- equal and bilateral Secured at: 22 cm Tube secured with: Tape

## 2011-09-13 NOTE — Preoperative (Signed)
Beta Blockers   Reason not to administer Beta Blockers:Not Applicable 
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
## 2011-09-13 NOTE — Anesthesia Postprocedure Evaluation (Signed)
  Anesthesia Post-op Note  Patient: Willie Reid  Procedure(s) Performed:  INTRAMEDULLARY (IM) NAIL FEMORAL  Patient Location:ICU  Anesthesia Type: General  Level of Consciousness: sedated  Airway and Oxygen Therapy: Patient connected to face mask oxygen  Post-op Pain: mild  Post-op Assessment: Post-op Vital signs reviewed  Post-op Vital Signs: stable  Complications: No apparent anesthesia complications

## 2011-09-13 NOTE — Transfer of Care (Signed)
Immediate Anesthesia Transfer of Care Note  Patient: Willie Reid  Procedure(s) Performed:  INTRAMEDULLARY (IM) NAIL FEMORAL  Patient Location: PACU and ICU  Anesthesia Type: General  Level of Consciousness: sedated  Airway & Oxygen Therapy: Patient connected to face mask oxygen  Post-op Assessment: Post -op Vital signs reviewed and stable  Post vital signs: stable  Complications: No apparent anesthesia complications
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
## 2011-09-13 NOTE — Progress Notes (Signed)
Pt transferred off unit to OR at 1600. Pt still away at 1900, unable to obtain vital signs and administer schedule medications during that time.

## 2011-09-14 LAB — TYPE AND SCREEN
ABO/RH(D): O POS
Unit division: 0
Unit division: 0
Unit division: 0
Unit division: 0

## 2011-09-14 MED ORDER — INFLUENZA VIRUS VACC SPLIT PF IM SUSP
0.5000 mL | Freq: Once | INTRAMUSCULAR | Status: AC
  Administered 2011-09-14: 0.5 mL via INTRAMUSCULAR
  Filled 2011-09-14: qty 0.5

## 2011-09-14 MED ORDER — CLONAZEPAM 1 MG PO TABS
1.0000 mg | ORAL_TABLET | Freq: Every evening | ORAL | Status: DC | PRN
  Administered 2011-09-14 – 2011-09-16 (×3): 2 mg via ORAL
  Administered 2011-09-18: 1 mg via ORAL
  Filled 2011-09-14 (×2): qty 2
  Filled 2011-09-14: qty 1
  Filled 2011-09-14: qty 2
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
## 2011-09-14 NOTE — Plan of Care (Signed)
Problem: Phase I Progression Outcomes Goal: Other Phase I Outcomes/Goals Plan changed to hip/femur fracture pathway

## 2011-09-14 NOTE — Plan of Care (Signed)
Problem: Phase I Progression Outcomes Goal: Post op clear liquids, advance diet as tolerated Outcome: Progressing Sips only

## 2011-09-14 NOTE — Progress Notes (Signed)
   Subjective: Pain is improving.  No c/o chest pain or SOB.   More alert this am.  No other c/o except back and LLE pain.  No LE N/T.    Objective: Vital signs in last 24 hours: Temp:  [97.3 F (36.3 C)-98.3 F (36.8 C)] 98.3 F (36.8 C) (11/06 0413) Pulse Rate:  [99-127] 99  (11/06 0600) Resp:  [18-25] 18  (11/06 0600) BP: (121-165)/(60-91) 123/60 mmHg (11/06 0600) SpO2:  [91 %-100 %] 98 % (11/06 0600)  Intake/Output from previous day: 11/05 0701 - 11/06 0700 In: 1500 [I.V.:1500] Out: 4130 [Urine:3930; Emesis/NG output:50; Blood:150] Intake/Output this shift:     Basename 09/13/11 1100 09/12/11 1415 09/12/11 0119 09/11/11 2036 09/11/11 2024  HGB 13.6 13.9 11.4* 16.0 15.4    Basename 09/13/11 1100 09/12/11 1415  WBC 13.6* 8.5  RBC 4.49 4.62  HCT 37.9* 39.7  PLT 104* 103*    Basename 09/13/11 1100 09/12/11 1415  NA 133* 136  K 3.6 3.9  CL 105 110  CO2 20 21  BUN 8 14  CREATININE 0.66 0.81  GLUCOSE 119* 130*  CALCIUM 7.2* 6.5*    Basename 09/11/11 2024  LABPT --  INR 1.27    Neurovascular intact Sensation intact distally Intact pulses distally Dorsiflexion/Plantar flexion intact Incision: dressing C/D/I Compartment soft  Assessment/Plan:   Up with therapy when cleared from spine standpoint. Weight Bearing as Tolerated (WBAT) LLE  VTE prophylaxis: regimen to be chosen by surgical team Will follow.    [name] 09/14/2011, 7:10 AM

## 2011-09-15 MED ORDER — PROMETHAZINE HCL 25 MG/ML IJ SOLN
12.5000 mg | INTRAMUSCULAR | Status: DC | PRN
  Administered 2011-09-15: 12.5 mg via INTRAVENOUS
  Filled 2011-09-15: qty 1

## 2011-09-15 MED ORDER — CHLORPROMAZINE HCL 25 MG/ML IJ SOLN
25.0000 mg | Freq: Four times a day (QID) | INTRAMUSCULAR | Status: DC | PRN
  Administered 2011-09-15 – 2011-09-21 (×6): 25 mg via INTRAMUSCULAR
  Filled 2011-09-15 (×6): qty 1

## 2011-09-15 MED FILL — Sodium Chloride IV Soln 0.9%: INTRAVENOUS | Qty: 1000 | Status: AC

## 2011-09-15 MED FILL — Sodium Chloride Irrigation Soln 0.9%: Qty: 3000 | Status: AC

## 2011-09-15 MED FILL — Heparin Sodium (Porcine) Inj 1000 Unit/ML: INTRAMUSCULAR | Qty: 1 | Status: AC

## 2011-09-15 MED FILL — Heparin Sodium (Porcine) Inj 1000 Unit/ML: INTRAMUSCULAR | Qty: 30 | Status: AC
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
## 2011-09-15 NOTE — Progress Notes (Signed)
Subjective: The patient is much more alert and awake today.  Has hiccups.  Just vomited large amount of bilious fluid.  Did not aspirate  Objective: Vital signs in last 24 hours: Temp:  [97.4 F (36.3 C)-98.3 F (36.8 C)] 98 F (36.7 C) (11/07 0606) Pulse Rate:  [91-116] 91  (11/07 0606) Resp:  [17-24] 18  (11/07 0606) BP: (121-147)/(74-87) 121/75 mmHg (11/07 0606) SpO2:  [92 %-96 %] 96 % (11/07 0606) Last BM Date: 09/11/11  Intake/Output from previous day: 11/06 0701 - 11/07 0700 In: 950 [I.V.:900; IV Piggyback:50] Out: 4225 [Urine:4225] Intake/Output this shift:    General: No acute distress except for vomiting.  Hiccups.  Lungs: Clear to auscultation.  Oxygen saturations are good.  Cardiac:  RRR, no murmurs  Abd: Soft, flat, hypoactive bowel sounds  Extremities: Complaining of left calf pain.  Neuro: Intact.  Concussion resolving.  Lab Results: CBC   Basename 09/13/11 1100 09/12/11 1415  WBC 13.6* 8.5  HGB 13.6 13.9  HCT 37.9* 39.7  PLT 104* 103*   BMET  Basename 09/13/11 1100 09/12/11 1415  NA 133* 136  K 3.6 3.9  CL 105 110  CO2 20 21  GLUCOSE 119* 130*  BUN 8 14  CREATININE 0.66 0.81  CALCIUM 7.2* 6.5*   PT/INR No results found for this basename: LABPROT:2,INR:2 in the last 72 hours ABG No results found for this basename: PHART:2,PCO2:2,PO2:2,HCO3:2 in the last 72 hours  Studies/Results: Dg Femur Left  09/13/2011  *RADIOLOGY REPORT*  Clinical Data: Left femur intraoperative fixation  LEFT FEMUR - 2 VIEW  Comparison: 09/11/2011  Findings: Six intraprocedural images demonstrate intramedullary rod fixation of the previously seen mid shaft left femoral fracture. Fracture fragments are in near anatomic alignment.  No evidence for hardware failure.  IMPRESSION: Expected intraoperative appearance after intramedullary rod fixation of previously seen mid shaft left femoral fracture.  Original Report Authenticated By: Harrel Lemon, M.D.   Dg Femur Left  Port  09/13/2011  *RADIOLOGY REPORT*  Clinical Data: Femur fracture  PORTABLE LEFT FEMUR - 2 VIEW  Comparison: 09/11/2011  Findings: An intramedullary rod is seen transfixing a midshaft femur fracture.  A butterfly fragment remains displaced.  To distal interlocking screws.  One proximal interlocking screw.  No breakage or loosening of the hardware.  IMPRESSION: ORIF femur fracture.  Original Report Authenticated By: Donavan Burnet, M.D.   Dg C-arm 61-120 Min  09/13/2011  CLINICAL DATA: orif left femur   C-ARM 61-120 MINUTES  Fluoroscopy was utilized by the requesting physician.  No radiographic  interpretation.      Anti-infectives: Anti-infectives     Start     Dose/Rate Route Frequency Ordered Stop   09/13/11 1630   ceFAZolin (ANCEF) IVPB 1 g/50 mL premix        1 g 100 mL/hr over 30 Minutes Intravenous  Once 09/13/11 1617 09/13/11 2316   09/12/11 1600   ceFAZolin (ANCEF) IVPB 1 g/50 mL premix        1 g 100 mL/hr over 30 Minutes Intravenous Every 8 hours 09/12/11 1535 09/13/11 0022   09/12/11 1015   ceFAZolin (ANCEF) injection 2 g  Status:  Discontinued        2 g Intramuscular On call to O.R. 09/12/11 1001 09/12/11 1535          Assessment/Plan: s/p Procedure(s): INTRAMEDULLARY (IM) NAIL FEMORAL Continue foley due to Continue foley due to Patient is flat on his back and non-ambulatory for 48 hours. Significant hiccups and  nausea.  Has anti-emetics, but nothing for the hiccups.  LOS: 4 days   Marta Lamas. Gae Bon, MD, FACS [phone] Trauma Surgeon 09/15/2011

## 2011-09-15 NOTE — Progress Notes (Signed)
*  PRELIMINARY RESULTS*  Left Lower Extremity Venous Doppler has been performed. No DVT seen in Left lower extremity.    Farrel Demark 09/15/2011, 4:27 PM

## 2011-09-15 NOTE — Progress Notes (Signed)
  PATIENT ID: Willie Reid  MRN: [id number]  DOB/AGE:  1976/01/27 / 35 y.o.  2 Days Post-Op Procedure(s) (LRB): INTRAMEDULLARY (IM) NAIL FEMORAL (Left)  Subjective: Pain is mild.  No c/o chest pain or SOB.   Leg is feeling much better.  No new MSK complaints.     Objective: Vital signs in last 24 hours: Temp:  [97.4 F (36.3 C)-98.3 F (36.8 C)] 98 F (36.7 C) (11/07 0606) Pulse Rate:  [91-116] 91  (11/07 0606) Resp:  [16-24] 18  (11/07 0606) BP: (121-147)/(70-87) 121/75 mmHg (11/07 0606) SpO2:  [92 %-96 %] 96 % (11/07 0606)  Intake/Output from previous day: 11/06 0701 - 11/07 0700 In: 950 [I.V.:900; IV Piggyback:50] Out: 4225 [Urine:4225] Intake/Output this shift:     Basename 09/13/11 1100 09/12/11 1415  HGB 13.6 13.9    Basename 09/13/11 1100 09/12/11 1415  WBC 13.6* 8.5  RBC 4.49 4.62  HCT 37.9* 39.7  PLT 104* 103*    Basename 09/13/11 1100 09/12/11 1415  NA 133* 136  K 3.6 3.9  CL 105 110  CO2 20 21  BUN 8 14  CREATININE 0.66 0.81  GLUCOSE 119* 130*  CALCIUM 7.2* 6.5*   No results found for this basename: LABPT:2,INR:2 in the last 72 hours  Physical Exam: LLE: Neurovascular intact Sensation intact distally Intact pulses distally Dorsiflexion/Plantar flexion intact Incision: no drainage No cellulitis present Compartment soft  Assessment/Plan: 2 Days Post-Op Procedure(s) (LRB): INTRAMEDULLARY (IM) NAIL FEMORAL (Left)   Up with therapy when cleared by neurosurg Weight Bearing as Tolerated (WBAT)  VTE prophylaxis: regimen to be chosen by surgical team Secondary survery, no other MSK complaints.  Will continue to assess. Dry dressing as needed.   Willie Reid 09/15/2011, 8:12 AM

## 2011-09-15 NOTE — Progress Notes (Signed)
CSW completed patient initial assessment - located in shadow chart.  CSW to complete SBIRT at later date.  [address] [place], [place] [phone]

## 2011-09-16 LAB — DIFFERENTIAL
Basophils Relative: 0 % (ref 0–1)
Eosinophils Absolute: 0.2 10*3/uL (ref 0.0–0.7)
Lymphs Abs: 1.3 10*3/uL (ref 0.7–4.0)
Neutro Abs: 14.1 10*3/uL — ABNORMAL HIGH (ref 1.7–7.7)
Neutrophils Relative %: 79 % — ABNORMAL HIGH (ref 43–77)

## 2011-09-16 LAB — BASIC METABOLIC PANEL
Chloride: 105 mEq/L (ref 96–112)
GFR calc Af Amer: >90 mL/min (ref >90)
GFR calc non Af Amer: >90 mL/min (ref >90)
Potassium: 3.6 mEq/L (ref 3.5–5.1)
Sodium: 136 mEq/L (ref 135–145)

## 2011-09-16 LAB — CBC
HCT: 35.4 % — ABNORMAL LOW (ref 39.0–52.0)
MCHC: 33.9 g/dL (ref 30.0–36.0)
RDW: 15.1 % (ref 11.5–15.5)

## 2011-09-16 MED ORDER — PANTOPRAZOLE SODIUM 40 MG PO TBEC
40.0000 mg | DELAYED_RELEASE_TABLET | Freq: Every day | ORAL | Status: DC
  Administered 2011-09-16 – 2011-09-17 (×2): 40 mg via ORAL
  Filled 2011-09-16 (×2): qty 1

## 2011-09-16 MED ORDER — DIPHENOXYLATE-ATROPINE 2.5-0.025 MG PO TABS
1.0000 | ORAL_TABLET | Freq: Four times a day (QID) | ORAL | Status: DC
  Administered 2011-09-16 – 2011-09-17 (×4): 1 via ORAL
  Filled 2011-09-16 (×7): qty 1

## 2011-09-16 NOTE — Progress Notes (Signed)
Subjective: The patient is having a lot of diarrhea.  Wants something to slow it down.  Objective: Vital signs in last 24 hours: Temp:  [97.3 F (36.3 C)-99.3 F (37.4 C)] 97.5 F (36.4 C) (11/08 0900) Pulse Rate:  [84-120] 115  (11/08 0900) Resp:  [16-22] 20  (11/08 0900) BP: (123-147)/(61-81) 135/71 mmHg (11/08 0900) SpO2:  [86 %-98 %] 91 % (11/08 0900) Last BM Date: 09/11/11  Intake/Output from previous day: 11/07 0701 - 11/08 0700 In: 2163 [I.V.:2163] Out: 1100 [Urine:1100] Intake/Output this shift: Total I/O In: -  Out: 452 [Urine:450; Stool:2]  General: Much better mental status wise.  Having diarrhea  Lungs: Clear to auscultation  Abd: Soft, very active BSs  Extremities: No changes  Neuro: Still at bedrest because of back fracture  Lab Results: CBC   Basename 09/16/11 0536  WBC 18.0*  HGB 12.0*  HCT 35.4*  PLT 234   BMET  Basename 09/16/11 0536  NA 136  K 3.6  CL 105  CO2 24  GLUCOSE 110*  BUN 23  CREATININE 0.82  CALCIUM 7.7*   PT/INR No results found for this basename: LABPROT:2,INR:2 in the last 72 hours ABG No results found for this basename: PHART:2,PCO2:2,PO2:2,HCO3:2 in the last 72 hours  Studies/Results: No results found.  Anti-infectives: Anti-infectives     Start     Dose/Rate Route Frequency Ordered Stop   09/13/11 2115   ceFAZolin (ANCEF) IVPB 1 g/50 mL premix        1 g 100 mL/hr over 30 Minutes Intravenous Every 6 hours 09/13/11 2113 09/14/11 1514   09/13/11 1630   ceFAZolin (ANCEF) IVPB 1 g/50 mL premix        1 g 100 mL/hr over 30 Minutes Intravenous  Once 09/13/11 1617 09/13/11 2316   09/12/11 1600   ceFAZolin (ANCEF) IVPB 1 g/50 mL premix        1 g 100 mL/hr over 30 Minutes Intravenous Every 8 hours 09/12/11 1535 09/13/11 0022   09/12/11 1015   ceFAZolin (ANCEF) injection 2 g  Status:  Discontinued        2 g Intramuscular On call to O.R. 09/12/11 1001 09/12/11 1535          Assessment/Plan: s/p  Procedure(s): INTRAMEDULLARY (IM) NAIL FEMORAL Advance diet Get stool for C.D\fiff toxin titer. Control diarrhea Check with Dr. Venetia Maxon to see if patient can come off bedrest (the patient has a L1 burst fracture)  LOS: 5 days   Marta Lamas. Gae Bon, MD, FACS [phone] Trauma Surgeon 09/16/2011

## 2011-09-16 NOTE — Progress Notes (Signed)
CSW met with patient at bedside to complete SBIRT assessment and discuss patient plans at dc.  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  Patient lives alone but has a girlfriend, mother, and grandmother who remain supportive and willing to provide 24 hour support as needed.  Patient agreeable to rehab or HH if necessary at dc.  CSW will continue to follow for emotional support and dc planning as needed.  [address] [place], [place] [phone]

## 2011-09-16 NOTE — Progress Notes (Signed)
  Subjective: Patient reports pain well controlled. No leg weakness or numbness  Objective: Vital signs in last 24 hours: Temp:  [97.5 F (36.4 C)-99.3 F (37.4 C)] 97.5 F (36.4 C) (11/08 0900) Pulse Rate:  [84-120] 110  (11/08 1434) Resp:  [16-22] 18  (11/08 1434) BP: (123-147)/(61-78) 137/78 mmHg (11/08 1434) SpO2:  [86 %-98 %] 96 % (11/08 1434)  Intake/Output from previous day: 11/07 0701 - 11/08 0700 In: 2163 [I.V.:2163] Out: 1100 [Urine:1100] Intake/Output this shift: Total I/O In: -  Out: 453 [Urine:450; Stool:3]  Physical Exam: Full strength both legs, no numbness.  Lab Results:  [facility] [place] [facility] 09/16/11 0536  WBC 18.0*  HGB 12.0*  HCT 35.4*  PLT 234   BMET  Basename 09/16/11 0536  NA 136  K 3.6  CL 105  CO2 24  GLUCOSE 110*  BUN 23  CREATININE 0.82  CALCIUM 7.7*    Studies/Results: No results found.  Assessment/Plan: Mobilize in TLSO.  Have pt fitted.  Must be in TLSO before out of bed.    LOS: 5 days    Dorian Heckle, MD 09/16/2011, 3:49 PM

## 2011-09-16 NOTE — Progress Notes (Signed)
PATIENT ID: Willie Reid  MRN: [id number]  DOB/AGE:  Dec 19, 1975 / 35 y.o.  3 Days Post-Op Procedure(s) (LRB): INTRAMEDULLARY (IM) NAIL FEMORAL (Left)  Subjective: Pain is mild.  No c/o chest pain or SOB.   Eager to get OOB.   Objective: Vital signs in last 24 hours: Temp:  [97.5 F (36.4 C)-99.3 F (37.4 C)] 97.5 F (36.4 C) (11/08 0900) Pulse Rate:  [84-120] 110  (11/08 1434) Resp:  [16-22] 18  (11/08 1434) BP: (123-147)/(61-78) 137/78 mmHg (11/08 1434) SpO2:  [86 %-98 %] 96 % (11/08 1434)  Intake/Output from previous day: 11/07 0701 - 11/08 0700 In: 2163 [I.V.:2163] Out: 1100 [Urine:1100] Intake/Output this shift: Total I/O In: -  Out: 453 [Urine:450; Stool:3]   Basename 09/16/11 0536  HGB 12.0*    Basename 09/16/11 0536  WBC 18.0*  RBC 4.06*  HCT 35.4*  PLT 234    Basename 09/16/11 0536  NA 136  K 3.6  CL 105  CO2 24  BUN 23  CREATININE 0.82  GLUCOSE 110*  CALCIUM 7.7*   No results found for this basename: LABPT:2,INR:2 in the last 72 hours  Physical Exam: LLE: Neurovascular intact Sensation intact distally Intact pulses distally Dorsiflexion/Plantar flexion intact Incision: dressing C/D/I Compartment soft  Assessment/Plan: 3 Days Post-Op Procedure(s) (LRB): INTRAMEDULLARY (IM) NAIL FEMORAL (Left)   Up with therapy tomorrow per Dr. Venetia Maxon. Weight Bearing as Tolerated (WBAT) LLE  VTE prophylaxis: per primary team    [name] 09/16/2011, 3:58 PM

## 2011-09-17 ENCOUNTER — Encounter (HOSPITAL_COMMUNITY): Payer: Self-pay | Admitting: General Surgery

## 2011-09-17 MED ORDER — MORPHINE SULFATE 4 MG/ML IJ SOLN
1.0000 mg | INTRAMUSCULAR | Status: DC | PRN
  Administered 2011-09-17 – 2011-09-19 (×2): 4 mg via INTRAVENOUS
  Administered 2011-09-19 (×2): 2 mg via INTRAVENOUS
  Administered 2011-09-19 – 2011-09-22 (×3): 4 mg via INTRAVENOUS
  Filled 2011-09-17 (×7): qty 1

## 2011-09-17 MED ORDER — OXYCODONE-ACETAMINOPHEN 5-325 MG PO TABS
1.0000 | ORAL_TABLET | ORAL | Status: DC | PRN
  Administered 2011-09-20 – 2011-09-21 (×2): 2 via ORAL
  Filled 2011-09-17 (×2): qty 2

## 2011-09-17 MED ORDER — TIZANIDINE HCL 4 MG PO TABS
4.0000 mg | ORAL_TABLET | Freq: Three times a day (TID) | ORAL | Status: DC | PRN
  Administered 2011-09-18: 4 mg via ORAL
  Filled 2011-09-17: qty 1

## 2011-09-17 MED ORDER — DEXTROSE IN LACTATED RINGERS 5 % IV SOLN
INTRAVENOUS | Status: DC
  Administered 2011-09-17 – 2011-09-20 (×6): via INTRAVENOUS

## 2011-09-17 MED ORDER — OXYCODONE HCL 5 MG PO TABS
5.0000 mg | ORAL_TABLET | ORAL | Status: DC | PRN
  Administered 2011-09-18 (×2): 10 mg via ORAL
  Administered 2011-09-19: 5 mg via ORAL
  Administered 2011-09-20 (×2): 10 mg via ORAL
  Filled 2011-09-17: qty 2
  Filled 2011-09-17: qty 1
  Filled 2011-09-17: qty 2
  Filled 2011-09-17: qty 1
  Filled 2011-09-17 (×2): qty 2

## 2011-09-17 NOTE — Progress Notes (Signed)
Patient examined and I agree with the assessment and plan  Subrena Devereux E  
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
## 2011-09-17 NOTE — Progress Notes (Signed)
PATIENT ID: Willie Reid  MRN: [id number]  DOB/AGE:  [birth year] / 35 y.o.  4 Days Post-Op Procedure(s) (LRB): INTRAMEDULLARY (IM) NAIL FEMORAL (Left)  Subjective: Pain is mild.  No c/o chest pain or SOB.   Did well with PT today in brace. Walked with walker   Objective: Vital signs in last 24 hours: Temp:  [98 F (36.7 C)-99.7 F (37.6 C)] 99.5 F (37.5 C) (11/09 1304) Pulse Rate:  [105-150] 145  (11/09 1510) Resp:  [18-32] 20  (11/09 1304) BP: (95-143)/(57-75) 97/70 mmHg (11/09 1510) SpO2:  [94 %-97 %] 95 % (11/09 1449)  Intake/Output from previous day: 11/08 0701 - 11/09 0700 In: 2304.8 [I.V.:2304.8] Out: 2455 [Urine:2450; Stool:5] Intake/Output this shift: Total I/O In: -  Out: 600 [Urine:600]   Basename 09/16/11 0536  HGB 12.0*    Basename 09/16/11 0536  WBC 18.0*  RBC 4.06*  HCT 35.4*  PLT 234    Basename 09/16/11 0536  NA 136  K 3.6  CL 105  CO2 24  BUN 23  CREATININE 0.82  GLUCOSE 110*  CALCIUM 7.7*   No results found for this basename: LABPT:2,INR:2 in the last 72 hours  Physical Exam: Intact pulses distally Incision: dressing C/D/I Compartment soft  Assessment/Plan: 4 Days Post-Op Procedure(s) (LRB): INTRAMEDULLARY (IM) NAIL FEMORAL (Left)   Up with therapy Weight Bearing as Tolerated (WBAT)  VTE prophylaxis: regimen to be chosen by surgical team Will follow   [name] 09/17/2011, 5:20 PM
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
## 2011-09-18 MED ORDER — CLONAZEPAM 1 MG PO TABS
1.0000 mg | ORAL_TABLET | Freq: Three times a day (TID) | ORAL | Status: DC | PRN
  Administered 2011-09-18 – 2011-09-23 (×6): 1 mg via ORAL
  Filled 2011-09-18 (×6): qty 1

## 2011-09-18 MED ORDER — BOOST PLUS PO LIQD
237.0000 mL | Freq: Two times a day (BID) | ORAL | Status: DC
  Administered 2011-09-19 – 2011-09-20 (×2): 237 mL via ORAL
  Administered 2011-09-20 – 2011-09-21 (×3): 1 via ORAL
  Filled 2011-09-18 (×12): qty 237

## 2011-09-18 MED ORDER — PANTOPRAZOLE SODIUM 40 MG PO TBEC
40.0000 mg | DELAYED_RELEASE_TABLET | Freq: Two times a day (BID) | ORAL | Status: DC
  Administered 2011-09-18 – 2011-09-19 (×3): 40 mg via ORAL
  Filled 2011-09-18 (×3): qty 1

## 2011-09-18 NOTE — Progress Notes (Signed)
Physical Therapy Treatment Patient Details Name: Willie Reid MRN: [id number] DOB: 1975-11-30 Today's Date: 09/18/2011  PT Assessment/Plan  PT - Assessment/Plan Comments on Treatment Session: Pt cont to require repeated cueing for safe technique with OOB mobility.  PT Plan: Discharge plan remains appropriate PT Frequency: Min 5X/week Follow Up Recommendations: Home health PT Equipment Recommended: Rolling walker with 5" wheels PT Goals  Acute Rehab PT Goals PT Goal: Rolling Supine to Left Side - Progress: Progressing toward goal PT Goal: Supine/Side to Sit - Progress: Progressing toward goal PT Goal: Sit to Supine/Side - Progress: Progressing toward goal PT Transfer Goal: Sit to Stand/Stand to Sit - Progress: Progressing toward goal PT Goal: Ambulate - Progress: Progressing toward goal PT Goal: Up/Down Stairs - Progress: Progressing toward goal Additional Goals PT Goal: Additional Goal #1 - Progress: Progressing toward goal  PT Treatment Precautions/Restrictions  Precautions Precautions: Back Precaution Booklet Issued: No Precaution Comments: reviewed back precautions with pt  Required Braces or Orthoses: Yes Spinal Brace: Thoracolumbosacral orthotic;Applied in supine position Restrictions Weight Bearing Restrictions: No LLE Weight Bearing: Weight bearing as tolerated Mobility (including Balance) Bed Mobility Bed Mobility: Yes Rolling Left: 5: Supervision Rolling Left Details (indicate cue type and reason): cueing for technique and precautions Left Sidelying to Sit: 5: Supervision Left Sidelying to Sit Details (indicate cue type and reason): cueing for technique and precautions Transfers Transfers: Yes Sit to Stand: 4: Min assist Sit to Stand Details (indicate cue type and reason): repeated cueing for hand placement, safe technique and Lt LE positioning.   Stand to Sit: 4: Min assist Stand to Sit Details: cueing for  Stand Pivot Transfers: 4: Min IT consultant Details (indicate cue type and reason): cueing for safety, sequencing, use of RW, from bed to chair. Ambulation/Gait Ambulation/Gait: Yes Ambulation/Gait Assistance: 5: Supervision;Patient percentage (comment) Ambulation/Gait Assistance Details (indicate cue type and reason): cues for sequencing, wbat on Lt LE,and use of RW. (2 trails) Ambulation Distance (Feet): 130 Feet (trial 1 = 80 feet      Trial 2 =130 feet) Assistive device: Rolling walker Gait Pattern: Step-to pattern Stairs: Yes Stairs Assistance: 4: Min assist Stairs Assistance Details (indicate cue type and reason): assist to manage RW, cues for technique Stair Management Technique: Backwards;With walker Number of Stairs: 3     Exercise    End of Session PT - End of Session Equipment Utilized During Treatment: Gait belt Activity Tolerance: Patient limited by fatigue Patient left: in chair;with family/visitor present Nurse Communication: Mobility status for transfers;Mobility status for ambulation General Behavior During Session: [facility] for tasks performed Cognition: Impaired  Willie Reid 09/18/2011, 12:29 PM
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
## 2011-09-19 MED ORDER — DIPHENOXYLATE-ATROPINE 2.5-0.025 MG PO TABS
1.0000 | ORAL_TABLET | Freq: Four times a day (QID) | ORAL | Status: DC | PRN
  Filled 2011-09-19: qty 1

## 2011-09-19 MED ORDER — LANSOPRAZOLE 15 MG PO CPDR
15.0000 mg | DELAYED_RELEASE_CAPSULE | Freq: Two times a day (BID) | ORAL | Status: DC
  Filled 2011-09-19 (×2): qty 1

## 2011-09-19 MED ORDER — NON FORMULARY: 20.0000 mg | Freq: Two times a day (BID) | Status: DC

## 2011-09-19 MED ORDER — LANSOPRAZOLE 30 MG PO CPDR
30.0000 mg | DELAYED_RELEASE_CAPSULE | Freq: Two times a day (BID) | ORAL | Status: DC
  Administered 2011-09-20 – 2011-09-23 (×7): 30 mg via ORAL
  Filled 2011-09-19 (×11): qty 1
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
## 2011-09-19 NOTE — Progress Notes (Signed)
PATIENT ID:      Willie Reid  MRN:     [id number] DOB/AGE:    10-Jun-1976 / 35 y.o.    PROGRESS NOTE Subjective: Progressing with PT. Has been wgt bearing as tolerated on the left.   Tolerating Diet: yes         Patient reports pain as 3 on 0-10 scale.    Objective: Vital signs in last 24 hours: Temp:  [98 F (36.7 C)-98.8 F (37.1 C)] 98.1 F (36.7 C) (11/11 0600) Pulse Rate:  [89-115] 105  (11/11 0600) Resp:  [18-20] 18  (11/11 0600) BP: (120-135)/(67-80) 135/67 mmHg (11/11 0600) SpO2:  [94 %-98 %] 97 % (11/11 0600)    Intake/Output from previous day: I/O last 3 completed shifts: In: 3915 [P.O.:1440; I.V.:2475] Out: 1975 [Urine:1975]   Intake/Output this shift:     LABORATORY DATA:  Examination: Pt alert and oriented. Left leg dressings clean and dry. Moderate thigh swelling. Calf soft. Neurovascular: Intact distally  Assessment:    POD #5 s/p Procedure(s) (LRB): INTRAMEDULLARY (IM) NAIL FEMORAL (Left)  Plan: PT as ordered Weight Bearing as Tolerated (WBAT)  DVT Prophylaxis:  per trauma service  Will follow.  Willie Witkop G, PA 09/19/2011, 9:20 AM

## 2011-09-19 NOTE — Progress Notes (Signed)
Patient examined and I agree with the assessment and plan  Jaidan Prevette E  

## 2011-09-19 NOTE — Progress Notes (Signed)
Physical Therapy Treatment Patient Details Name: Willie Reid MRN: [id number] DOB: 03-01-1976 Today's Date: 09/19/2011  PT Assessment/Plan  PT - Assessment/Plan Comments on Treatment Session: Pt/Mother instructed to don/doff  TLSO in supine.  Pt  mobility and activity tolerence improving.    PT Plan: Discharge plan remains appropriate PT Frequency: Min 5X/week Follow Up Recommendations: Home health PT Equipment Recommended: Rolling walker with 5" wheels PT Goals  Acute Rehab PT Goals PT Goal Formulation: With patient Time For Goal Achievement: 2 weeks Pt will Roll Supine to Left Side: with supervision PT Goal: Rolling Supine to Left Side - Progress: Met Pt will go Supine/Side to Sit: with supervision PT Goal: Supine/Side to Sit - Progress: Met Pt will go Sit to Supine/Side: with supervision PT Goal: Sit to Supine/Side - Progress: Met Pt will Transfer Sit to Stand/Stand to Sit: with supervision PT Transfer Goal: Sit to Stand/Stand to Sit - Progress: Met Pt will Ambulate: 51 - 150 feet;with least restrictive assistive device;with supervision PT Goal: Ambulate - Progress: Progressing toward goal Pt will Go Up / Down Stairs: 3-5 stairs;with least restrictive assistive device;with min assist PT Goal: Up/Down Stairs - Progress: Other (comment) (not addressed) Additional Goals Additional Goal #1: Pt will independently state and demonstrate awareness of 3/3 back precautions. PT Goal: Additional Goal #1 - Progress: Met  PT Treatment Precautions/Restrictions  Precautions Precautions: Back Precaution Booklet Issued: No Precaution Comments:  (pt able to recall 3/3 back precautions) Required Braces or Orthoses: Yes Spinal Brace: Thoracolumbosacral orthotic Restrictions Weight Bearing Restrictions: No LLE Weight Bearing: Weight bearing as tolerated Mobility (including Balance) Bed Mobility Bed Mobility: Yes Rolling Right: 6: Modified independent (Device/Increase time);With rail  (2 trials) Rolling Left: 6: Modified independent (Device/Increase time);With rail (2 trials) Rolling Left Details (indicate cue type and reason): pt using rails to assist  Right Sidelying to Sit: 5: Supervision;With rails;HOB flat Right Sidelying to Sit Details (indicate cue type and reason): cues for technique  Left Sidelying to Sit: Not tested (comment) Supine to Sit: Not tested (comment) Sitting - Scoot to Edge of Bed: 7: Independent Sit to Supine - Right: 5: Supervision;With rail;HOB flat Sit to Supine - Right Details (indicate cue type and reason): cues for assisting Lt LE with Rt LE.   Sit to Supine - Left: Not tested (comment) Transfers Transfers: Yes Sit to Stand: 5: Supervision Sit to Stand Details (indicate cue type and reason): cues for hand placement Stand to Sit: 5: Supervision Stand to Sit Details: cueing for hand placement Stand Pivot Transfers: Not tested (comment) Ambulation/Gait Ambulation/Gait: Yes Ambulation/Gait Assistance: 5: Supervision Ambulation/Gait Assistance Details (indicate cue type and reason): supervision for safety.  2 standing rest breaks. Ambulation Distance (Feet): 150 Feet Assistive device: Rolling walker Gait Pattern: Step-to pattern;Antalgic Stairs: No  Balance Balance Assessed: No Exercise  General Exercises - Lower Extremity Ankle Circles/Pumps: AROM;Both;10 reps;Seated Long Arc Quad: AROM;Strengthening;Both;10 reps;Seated End of Session PT - End of Session Equipment Utilized During Treatment: Gait belt;Back brace Activity Tolerance: Patient limited by fatigue Patient left: in chair;with family/visitor present (Mother present) General Behavior During Session: [facility] for tasks performed Cognition: Impaired (Memory of prior event such as how much he ate yesterday. )  Theron Arista L. Faelynn Wynder DPT [phone] 09/19/2011, 1:26 PM

## 2011-09-20 ENCOUNTER — Inpatient Hospital Stay (HOSPITAL_COMMUNITY): Payer: No Typology Code available for payment source

## 2011-09-20 DIAGNOSIS — S72309A Unspecified fracture of shaft of unspecified femur, initial encounter for closed fracture: Secondary | ICD-10-CM

## 2011-09-20 DIAGNOSIS — S32009A Unspecified fracture of unspecified lumbar vertebra, initial encounter for closed fracture: Secondary | ICD-10-CM

## 2011-09-20 DIAGNOSIS — R17 Unspecified jaundice: Secondary | ICD-10-CM | POA: Diagnosis not present

## 2011-09-20 LAB — COMPREHENSIVE METABOLIC PANEL
ALT: 55 U/L — ABNORMAL HIGH (ref 0–53)
AST: 33 U/L (ref 0–37)
Albumin: 2.1 g/dL — ABNORMAL LOW (ref 3.5–5.2)
Alkaline Phosphatase: 83 U/L (ref 39–117)
Chloride: 96 mEq/L (ref 96–112)
Potassium: 3.2 mEq/L — ABNORMAL LOW (ref 3.5–5.1)
Sodium: 130 mEq/L — ABNORMAL LOW (ref 135–145)
Total Protein: 5.5 g/dL — ABNORMAL LOW (ref 6.0–8.3)

## 2011-09-20 LAB — CBC
HCT: 30.6 % — ABNORMAL LOW (ref 39.0–52.0)
MCH: 30.4 pg (ref 26.0–34.0)
MCHC: 35 g/dL (ref 30.0–36.0)
MCV: 86.9 fL (ref 78.0–100.0)
RDW: 15.3 % (ref 11.5–15.5)

## 2011-09-20 NOTE — Plan of Care (Signed)
Problem: Phase II Progression Outcomes Goal: Other Phase II Outcomes/Goals Outcome: Progressing Improving cognitive abilities, eval complete.

## 2011-09-20 NOTE — Progress Notes (Signed)
CSW continuing to follow patient for emotional support and dc planning needs.  Patient was down in ultrasound during CSW attempt to see patient.  CSW will follow up with patient to provide support as needed.  [address] [place], [place] [phone]

## 2011-09-20 NOTE — Progress Notes (Signed)
PATIENT ID: Willie Reid  MRN: [id number]  DOB/AGE:  February 05, 1976 / 35 y.o.  7 Days Post-Op Procedure(s) (LRB): INTRAMEDULLARY (IM) NAIL FEMORAL (Left)  Subjective: Pain is mild.  No c/o chest pain or SOB.   Doing well with PT.    Objective: Vital signs in last 24 hours: Temp:  [97.9 F (36.6 C)-100 F (37.8 C)] 100 F (37.8 C) (11/12 0900) Pulse Rate:  [84-94] 94  (11/12 0900) Resp:  [17-20] 18  (11/12 0900) BP: (128-151)/(74-86) 146/74 mmHg (11/12 0900) SpO2:  [97 %-100 %] 97 % (11/12 0900)  Intake/Output from previous day: 11/11 0701 - 11/12 0700 In: 1531 [I.V.:1531] Out: 1500 [Urine:1500] Intake/Output this shift: Total I/O In: 240 [P.O.:240] Out: 800 [Urine:800]   Basename 09/20/11 1037  HGB 10.7*    Basename 09/20/11 1037  WBC 14.8*  RBC 3.52*  HCT 30.6*  PLT 521*    Basename 09/20/11 1037  NA 130*  K 3.2*  CL 96  CO2 25  BUN 9  CREATININE 0.57  GLUCOSE 129*  CALCIUM 7.8*   No results found for this basename: LABPT:2,INR:2 in the last 72 hours  Physical Exam: Neurovascular intact Sensation intact distally Intact pulses distally Dorsiflexion/Plantar flexion intact Incision: no drainage Compartment soft  Assessment/Plan: 7 Days Post-Op Procedure(s) (LRB): INTRAMEDULLARY (IM) NAIL FEMORAL (Left)   Up with therapy Weight Bearing as Tolerated (WBAT)  VTE prophylaxis: regimen to be chosen by surgical team Will follow   [name] 09/20/2011, 3:11 PM
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
## 2011-09-20 NOTE — Progress Notes (Signed)
Physical Therapy Treatment Patient Details Name: Chanse Kagel MRN: [id number] DOB: 1976-06-25 Today's Date: 09/20/2011  PT Assessment/Plan  PT - Assessment/Plan Comments on Treatment Session: session ended secondary to pt to be taken for abdominal US  for complaint of Abdominal pain. PT Plan: Discharge plan needs to be updated PT Frequency: Min 5X/week Follow Up Recommendations: Inpatient Rehab Equipment Recommended: Defer to next venue PT Goals  Acute Rehab PT Goals PT Goal Formulation: With patient Time For Goal Achievement: 2 weeks Pt will Roll Supine to Left Side: with supervision PT Goal: Rolling Supine to Left Side - Progress: Met Pt will go Supine/Side to Sit: with supervision PT Goal: Supine/Side to Sit - Progress: Met Pt will go Sit to Supine/Side: with supervision PT Goal: Sit to Supine/Side - Progress: Met Pt will Transfer Sit to Stand/Stand to Sit: with supervision PT Transfer Goal: Sit to Stand/Stand to Sit - Progress: Met Pt will Ambulate: 51 - 150 feet;with least restrictive assistive device;with supervision PT Goal: Ambulate - Progress: Progressing toward goal Pt will Go Up / Down Stairs: 3-5 stairs;with least restrictive assistive device;with min assist PT Goal: Up/Down Stairs - Progress: Progressing toward goal  PT Treatment Precautions/Restrictions  Precautions Precautions: Back Precaution Booklet Issued: No Precaution Comments:  (pt able to recall 3/3 back precautions) Required Braces or Orthoses: Yes Spinal Brace: Thoracolumbosacral orthotic Restrictions Weight Bearing Restrictions: No LLE Weight Bearing: Weight bearing as tolerated Mobility (including Balance) Bed Mobility Bed Mobility: Yes Rolling Right: 7: Independent;With rail (2 trials) Rolling Left: 7: Independent;With rail (2 trials) Right Sidelying to Sit: 5: Supervision;With rails;HOB flat Left Sidelying to Sit: Not tested (comment) Supine to Sit: Not tested (comment) Sitting - Scoot  to Edge of Bed: 7: Independent Sit to Supine - Right: 5: Supervision;With rail;HOB flat Sit to Supine - Left: Not tested (comment) Transfers Transfers: Yes Sit to Stand: 5: Supervision Stand to Sit: 5: Supervision Stand Pivot Transfers: Not tested (comment) Ambulation/Gait Ambulation/Gait: Yes Ambulation/Gait Assistance: 5: Supervision Ambulation Distance (Feet): 175 Feet Assistive device: Rolling walker Gait Pattern: Step-to pattern;Antalgic Stairs: Yes Stairs Assistance: 4: Min assist Stairs Assistance Details (indicate cue type and reason): pt forgot all prior instruction.  Cues fo technique and safety.  assist for walker management Stair Management Technique: Backwards;With walker Number of Stairs: 3  Height of Stairs: 2   Balance Balance Assessed: No Exercise    End of Session PT - End of Session Equipment Utilized During Treatment: Gait belt Activity Tolerance: Patient tolerated treatment well Patient left: in bed General Behavior During Session: Flat affect Cognition: Impaired   Thom Ollinger L. Dasani Thurlow DPT [phone] 09/20/2011, 5:50 PM
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
## 2011-09-21 LAB — BILIRUBIN, FRACTIONATED(TOT/DIR/INDIR)
Indirect Bilirubin: 1.9 mg/dL — ABNORMAL HIGH (ref 0.3–0.9)
Total Bilirubin: 3.3 mg/dL — ABNORMAL HIGH (ref 0.3–1.2)

## 2011-09-21 LAB — COMPREHENSIVE METABOLIC PANEL
Albumin: 2.1 g/dL — ABNORMAL LOW (ref 3.5–5.2)
BUN: 12 mg/dL (ref 6–23)
Calcium: 8.1 mg/dL — ABNORMAL LOW (ref 8.4–10.5)
Chloride: 101 mEq/L (ref 96–112)
Creatinine, Ser: 0.84 mg/dL (ref 0.50–1.35)
GFR calc non Af Amer: >90 mL/min (ref >90)
Total Bilirubin: 4.8 mg/dL — ABNORMAL HIGH (ref 0.3–1.2)

## 2011-09-21 LAB — CBC
HCT: 31 % — ABNORMAL LOW (ref 39.0–52.0)
MCH: 30.3 pg (ref 26.0–34.0)
MCHC: 33.9 g/dL (ref 30.0–36.0)
MCV: 89.6 fL (ref 78.0–100.0)
RDW: 16.3 % — ABNORMAL HIGH (ref 11.5–15.5)
WBC: 16 10*3/uL — ABNORMAL HIGH (ref 4.0–10.5)

## 2011-09-21 LAB — URINALYSIS, ROUTINE W REFLEX MICROSCOPIC
Glucose, UA: NEGATIVE mg/dL
Hgb urine dipstick: NEGATIVE
Ketones, ur: NEGATIVE mg/dL
pH: 6 (ref 5.0–8.0)

## 2011-09-21 LAB — URINE MICROSCOPIC-ADD ON

## 2011-09-21 MED ORDER — CALCIUM CARBONATE 1250 (500 CA) MG PO TABS
1.0000 | ORAL_TABLET | Freq: Two times a day (BID) | ORAL | Status: DC
  Administered 2011-09-21 – 2011-09-23 (×5): 500 mg via ORAL
  Filled 2011-09-21 (×8): qty 1

## 2011-09-21 MED ORDER — OXYCODONE HCL 5 MG PO TABS
10.0000 mg | ORAL_TABLET | ORAL | Status: DC | PRN
  Administered 2011-09-21 – 2011-09-22 (×4): 10 mg via ORAL
  Administered 2011-09-23: 20 mg via ORAL
  Filled 2011-09-21 (×4): qty 2
  Filled 2011-09-21: qty 4

## 2011-09-21 MED ORDER — POTASSIUM CHLORIDE CRYS ER 20 MEQ PO TBCR
20.0000 meq | EXTENDED_RELEASE_TABLET | Freq: Two times a day (BID) | ORAL | Status: DC
  Administered 2011-09-21 – 2011-09-23 (×5): 20 meq via ORAL
  Filled 2011-09-21 (×7): qty 1

## 2011-09-21 MED ORDER — CIPROFLOXACIN HCL 500 MG PO TABS
500.0000 mg | ORAL_TABLET | Freq: Two times a day (BID) | ORAL | Status: DC
  Administered 2011-09-21 – 2011-09-23 (×4): 500 mg via ORAL
  Filled 2011-09-21 (×6): qty 1

## 2011-09-21 MED ORDER — ENOXAPARIN SODIUM 40 MG/0.4ML ~~LOC~~ SOLN
40.0000 mg | SUBCUTANEOUS | Status: DC
  Administered 2011-09-21 – 2011-09-23 (×3): 40 mg via SUBCUTANEOUS
  Filled 2011-09-21 (×3): qty 0.4

## 2011-09-21 NOTE — Progress Notes (Signed)
Physical Therapy Treatment Patient Details Name: Willie Reid MRN: [id number] DOB: 05-Jun-1976 Today's Date: 09/21/2011  PT Assessment/Plan  PT - Assessment/Plan Comments on Treatment Session: Pt. tolerated treatment well, continues to have difficulty recalling back precautions needing cueing for precautions and sequencing. PT Plan: Discharge plan remains appropriate PT Goals  Acute Rehab PT Goals Pt will Roll Supine to Left Side: Independently PT Goal: Rolling Supine to Left Side - Progress: Other (comment) Pt will go Sit to Supine/Side: Independently PT Goal: Sit to Supine/Side - Progress: Other (comment) Pt will Transfer Sit to Stand/Stand to Sit: Independently PT Transfer Goal: Sit to Stand/Stand to Sit - Progress: Other (comment) PT Goal: Ambulate - Progress: Met Pt will Go Up / Down Stairs: Other (comment) (Pt. to direct sequence without cues) PT Goal: Up/Down Stairs - Progress: Progressing toward goal Additional Goals PT Goal: Additional Goal #1 - Progress: Progressing toward goal  PT Treatment Precautions/Restrictions  Precautions Precautions: Back Precaution Booklet Issued: No Precaution Comments:  (pt able to recall 3/3 back precautions) Required Braces or Orthoses: Yes Spinal Brace: Thoracolumbosacral orthotic;Applied in supine position Restrictions Weight Bearing Restrictions: No LLE Weight Bearing: Weight bearing as tolerated Mobility (including Balance) Bed Mobility Bed Mobility: Yes Rolling Right: 7: Independent Rolling Left: 5: Supervision Rolling Left Details (indicate cue type and reason): Verbal cues for technique. Left Sidelying to Sit: 7: Independent Sitting - Scoot to Edge of Bed: 5: Supervision Sitting - Scoot to [place] of Bed Details (indicate cue type and reason): Cueing needed to scoot to EOB.  Transfers Sit to Stand: 5: Supervision;From bed;From chair/3-in-1;Other (comment) (Mat table) Sit to Stand Details (indicate cue type and reason):  Cueing needed to use arms when standing, performed three trials.  Stand to Sit: 5: Supervision;To chair/3-in-1;To toilet;Other (comment) (Mat table) Stand to Sit Details: Cueing needed to use arms to lower to chair. Ambulation/Gait Ambulation/Gait: Yes Ambulation/Gait Assistance: 5: Supervision Ambulation/Gait Assistance Details (indicate cue type and reason): Cueing to normalize gait.  Ambulation Distance (Feet): 100 Feet (x 2 trials with seated rest between trials) Assistive device: Rolling walker Gait Pattern: Step-through pattern;Decreased stance time - left;Decreased stride length (Pt. using step-through with delay between strides) Gait velocity: 30/23= 1.3 which places patient as a significant fall risk. Stairs: Yes Stairs Assistance: 4: Min assist Stairs Assistance Details (indicate cue type and reason): Cueing for sequence and min assist for walker placement.  Stair Management Technique: No rails;Backwards;Forwards;With walker Number of Stairs: 3  (2 for second trial) Height of Stairs: 4  (8 inch stairs on second trial)    Exercise    End of Session PT - End of Session Equipment Utilized During Treatment: Gait belt;Back brace Activity Tolerance: Patient tolerated treatment well Patient left: in chair;with call bell in reach;with family/visitor present Nurse Communication: Mobility status for transfers;Mobility status for ambulation General Behavior During Session: [facility] for tasks performed Cognition: Impaired Cognitive Impairment: Short term memory, problem solving, executive functions impaired. Requested Pt. To be out of bed for all meals and discussed with nursing.   Laney Pastor 09/21/2011, 1:33 PM   Toney Sang [phone]

## 2011-09-21 NOTE — Progress Notes (Signed)
PATIENT ID: Willie Reid  MRN: [id number]  DOB/AGE:  May 23, 1976 / 35 y.o.  8 Days Post-Op Procedure(s) (LRB): INTRAMEDULLARY (IM) NAIL FEMORAL (Left)  Subjective: Pain is mild.  No c/o chest pain or SOB.   No new c/o. Up with PT.   Objective: Vital signs in last 24 hours: Temp:  [98.1 F (36.7 C)-99.7 F (37.6 C)] 99.7 F (37.6 C) (11/13 1034) Pulse Rate:  [100-107] 100  (11/13 1034) Resp:  [18-20] 20  (11/13 1034) BP: (109-142)/(68-83) 120/73 mmHg (11/13 1034) SpO2:  [94 %-97 %] 97 % (11/13 1034)  Intake/Output from previous day: 11/12 0701 - 11/13 0700 In: 2313 [P.O.:960; I.V.:1353] Out: 2975 [Urine:2975] Intake/Output this shift: Total I/O In: 240 [P.O.:240] Out: -    Basename 09/21/11 0540 09/20/11 1037  HGB 10.5* 10.7*    Basename 09/21/11 0540 09/20/11 1037  WBC 16.0* 14.8*  RBC 3.46* 3.52*  HCT 31.0* 30.6*  PLT 603* 521*    Basename 09/21/11 0540 09/20/11 1037  NA 137 130*  K 3.4* 3.2*  CL 101 96  CO2 27 25  BUN 12 9  CREATININE 0.84 0.57  GLUCOSE 92 129*  CALCIUM 8.1* 7.8*   No results found for this basename: LABPT:2,INR:2 in the last 72 hours  Physical Exam: Neurovascular intact Sensation intact distally Intact pulses distally Dorsiflexion/Plantar flexion intact Incision: no drainage Compartment soft  Assessment/Plan: 8 Days Post-Op Procedure(s) (LRB): INTRAMEDULLARY (IM) NAIL FEMORAL (Left)   Up with therapy Weight Bearing as Tolerated (WBAT)  VTE prophylaxis: regimen to be chosen by surgical team Will follow   [name] 09/21/2011, 1:44 PM

## 2011-09-21 NOTE — Progress Notes (Signed)
OT Cancellation Note  Treatment cancelled today due to patient's refusal to participate because of feeling extremely tired and weak. Pt. Indicated he was medicated and unable to keep eyes open when talking. Will continue to follow and check back as schedule permits.   Thank you!  Otis Peak OTS 09/21/2011  09/21/2011 Lucile Shutters   OTR/L Pager: [phone] Office: [phone] .

## 2011-09-21 NOTE — Progress Notes (Signed)
Patient examined and I agree with the assessment and plan Tolerating  Diet, check UA Kambre Messner E
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
## 2011-09-22 DIAGNOSIS — D72829 Elevated white blood cell count, unspecified: Secondary | ICD-10-CM

## 2011-09-22 LAB — COMPREHENSIVE METABOLIC PANEL
Albumin: 2.2 g/dL — ABNORMAL LOW (ref 3.5–5.2)
Alkaline Phosphatase: 130 U/L — ABNORMAL HIGH (ref 39–117)
BUN: 18 mg/dL (ref 6–23)
Creatinine, Ser: 0.68 mg/dL (ref 0.50–1.35)
Potassium: 4.1 mEq/L (ref 3.5–5.1)
Total Protein: 5.8 g/dL — ABNORMAL LOW (ref 6.0–8.3)

## 2011-09-22 LAB — CBC
HCT: 32.4 % — ABNORMAL LOW (ref 39.0–52.0)
MCHC: 33.6 g/dL (ref 30.0–36.0)
RDW: 16.9 % — ABNORMAL HIGH (ref 11.5–15.5)

## 2011-09-22 NOTE — Plan of Care (Signed)
Problem: Phase III Progression Outcomes Goal: Incision clean - minimal/no drainage Outcome: Progressing Mid abd incision clean, no staples, dressing, or drainage  Lt hip dressing stained

## 2011-09-22 NOTE — PMR Pre-admission (Signed)
  Evaluated for possible admission.  Please see rehab MD consult.  Patient is doing too well for acute inpatient rehab.  Options will be home with home health follow-up or can consider skilled nursing facility if indicated.  Pager [phone]

## 2011-09-22 NOTE — PMR Pre-admission (Signed)
  I met with patient and his friend, Kendal Hymen (a male massage therapist friend).  Kendal Hymen says she can provide supervision upon discharge.  I don't think an inpatient rehab admission would be of much benefit.  Patient could likely go home with supervision and go to outpatient therapy or have home health therapy.  Outpatient therapy would be good for focusing on higher level cognitive issues. I think he is doing too well to get insurance authorization for inpatient rehab. Call me for any questions.  Pager [phone]

## 2011-09-22 NOTE — Progress Notes (Signed)
CSW spoke to patient per MD request about possible SNF placement.  Patient is agreeable to SNF search in [facility].  CSW initiated SNF search and will follow up with patient and patient family to provide bed offers.    FL2 located on the shadow chart.  [address] [place], [place] [phone]

## 2011-09-22 NOTE — Progress Notes (Signed)
healed 

## 2011-09-22 NOTE — Progress Notes (Signed)
CSW following patient for dc planning needs.  Patient plans to return home with home health services and family support.  No further SW needs at this time.  Please re - consult if SW needs arise.  [address] [place], [place] [phone]
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
## 2011-09-22 NOTE — Plan of Care (Signed)
Problem: Phase III Progression Outcomes Goal: Activity at appropriate level-compared to baseline (UP IN CHAIR FOR HEMODIALYSIS)  Outcome: Progressing Pt currently tolerating OOB 1 hour per mother. Pt educated with mother and grandmother present OOB for all meals to build up activity tolerance.
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
## 2011-09-22 NOTE — Progress Notes (Signed)
This patient has been seen and I agree with the findings and treatment plan. Ultimately needs to have supervision per therapy  Marta Lamas. Gae Bon, MD, FACS [phone] (pager) [phone] (direct pager) Trauma Surgeon

## 2011-09-23 ENCOUNTER — Encounter (HOSPITAL_COMMUNITY): Payer: Self-pay | Admitting: Orthopedic Surgery

## 2011-09-23 DIAGNOSIS — N390 Urinary tract infection, site not specified: Secondary | ICD-10-CM | POA: Diagnosis not present

## 2011-09-23 LAB — DIFFERENTIAL
Eosinophils Relative: 4 % (ref 0–5)
Lymphs Abs: 2.2 10*3/uL (ref 0.7–4.0)
Monocytes Relative: 10 % (ref 3–12)
Neutro Abs: 12.4 10*3/uL — ABNORMAL HIGH (ref 1.7–7.7)

## 2011-09-23 LAB — CBC
HCT: 30.5 % — ABNORMAL LOW (ref 39.0–52.0)
MCV: 93 fL (ref 78.0–100.0)
RBC: 3.28 MIL/uL — ABNORMAL LOW (ref 4.22–5.81)
WBC: 17.2 10*3/uL — ABNORMAL HIGH (ref 4.0–10.5)

## 2011-09-23 MED ORDER — OXYCODONE-ACETAMINOPHEN 10-325 MG PO TABS: 1.0000 | ORAL_TABLET | ORAL | Status: AC | PRN

## 2011-09-23 MED ORDER — CIPROFLOXACIN HCL 500 MG PO TABS: 500.0000 mg | ORAL_TABLET | Freq: Two times a day (BID) | ORAL | Status: AC

## 2011-09-23 MED ORDER — PNEUMOCOCCAL VAC POLYVALENT 25 MCG/0.5ML IJ INJ: 0.5000 mL | INJECTION | INTRAMUSCULAR | Status: DC

## 2011-09-23 MED ORDER — MENINGOCOCCAL A C Y&W-135 CONJ IM INJ
0.5000 mL | INJECTION | Freq: Once | INTRAMUSCULAR | Status: AC
  Administered 2011-09-23: 0.5 mL via INTRAMUSCULAR
  Filled 2011-09-23 (×2): qty 0.5

## 2011-09-23 MED ORDER — HAEMOPHILUS B POLYSAC CONJ VAC IM SOLR
0.5000 mL | Freq: Once | INTRAMUSCULAR | Status: AC
  Administered 2011-09-23: 0.5 mL via INTRAMUSCULAR
  Filled 2011-09-23 (×2): qty 0.5

## 2011-09-23 NOTE — Discharge Summary (Signed)
Agree with summary. 

## 2011-09-23 NOTE — Progress Notes (Signed)
Clinical Social Worker received phone call from patient mother early morning on 11/15.  Patient mother states that patient family/friends have arranged a schedule to provide the 24 hour care at home.  Case management has made arrangements for home health PT/OT/ST.  Patient no longer agreeable to SNF level of care - plan is to discharge home 11/15.  No further SW needs at this time.    [address] [place], [place] [phone]

## 2011-09-23 NOTE — Progress Notes (Signed)
Physical Therapy Treatment Patient Details Name: Willie Reid MRN: [id number] DOB: [birth year] Today's Date: 09/23/2011  PT Assessment/Plan  PT - Assessment/Plan Comments on Treatment Session: Pt. continues to have difficulty recalling back precaution and recalling proper sequencing for stairs.  Pt.'s family member given a copy of back precautions and handout for proper stair managament with walker, and instructed in donning of TLSO.  PT Plan: Discharge plan needs to be updated Follow Up Recommendations: Home health PT;24 hour supervision/assistance Equipment Recommended: Rolling walker with 5" wheels;3 in 1 bedside comode PT Goals  Acute Rehab PT Goals PT Goal: Rolling Supine to Left Side - Progress: Met PT Goal: Supine/Side to Sit - Progress: Met PT Transfer Goal: Sit to Stand/Stand to Sit - Progress: Progressing toward goal PT Goal: Ambulate - Progress: Met PT Goal: Up/Down Stairs - Progress: Progressing toward goal Additional Goals PT Goal: Additional Goal #1 - Progress: Progressing toward goal  PT Treatment Precautions/Restrictions  Precautions Precautions: Back Precaution Booklet Issued: Yes (comment) Precaution Comments: Reviewed back precautions with pt. Required Braces or Orthoses: Yes Spinal Brace: Thoracolumbosacral orthotic Restrictions Weight Bearing Restrictions: Yes LLE Weight Bearing: Weight bearing as tolerated Mobility (including Balance) Bed Mobility Bed Mobility: Yes Rolling Right: 7: Independent Rolling Left: 7: Independent Left Sidelying to Sit: 7: Independent;HOB flat Left Sidelying to Sit Details (indicate cue type and reason): Family member put hands on pt., but no assist required.  Sitting - Scoot to Edge of Bed: 7: Independent Transfers Sit to Stand: 5: Supervision;From bed Sit to Stand Details (indicate cue type and reason): Cues for hand placement on bed.  Stand to Sit: 5: Supervision;To chair/3-in-1 Stand to Sit Details: Cues for hand  placement.  Ambulation/Gait Ambulation/Gait Assistance: 5: Supervision Ambulation/Gait Assistance Details (indicate cue type and reason): Cues to normalize gait.  Ambulation Distance (Feet): 300 Feet Assistive device: Rolling walker Gait Pattern: Step-through pattern;Decreased stance time - left;Decreased stride length Gait velocity: Pt. alternates between step-to and step-through patterns, cueing required to normalize gait.  Stairs Assistance: 4: Min Editor, commissioning Details (indicate cue type and reason): Cueing for sequence and walker placement, assistance with walker stabilization on stairs.  Stair Management Technique: Backwards;Forwards;With walker;No rails Number of Stairs: 2  (2 trials, family member assisted for second trial ) Height of Stairs: 8     Exercise    End of Session PT - End of Session Equipment Utilized During Treatment: Back brace Activity Tolerance: Patient tolerated treatment well Patient left: in chair;with call bell in reach;with family/visitor present General Behavior During Session: [facility] for tasks performed Cognition: Impaired Cognitive Impairment: Short term memory, problem solving, executive functions impaired.  Uses inappropriate descriptors (states his shoes felt fuzzy instead of loose)  Mehlani Blankenburg, SPT  09/23/2011, 12:03 PM

## 2011-09-23 NOTE — Progress Notes (Signed)
D/C instructions discussed and given to pt. Removed staples and placed 1/2 inch steri strips on left leg incisions, per physician orders. Pt tolerated well. HH set-up and ready for pt. Pt assessment unchanged from this morning assessment.  Pt d/c home with family member.
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
## 2011-09-23 NOTE — Progress Notes (Signed)
Speech Pathology:  Treatment Note  Subjective:  Patient alert, pleasant and cooperative "I think I'm doing much better"  Objective:  Patient alert and oriented. Able to make needs known and follow multi-step direction. Able to sustain attention to clinician driven basic conversation for 15 minutes. Patient required moderate verbal cues to alternate attention between two tasks.  Continues to present with very minimal but present language of confusion although states "Yeah, I use weird language" when questioned about it, as well as decreased intellectual awareness of abilities. Requires mod verbal assist for abstract thinking regarding deficits and plan. Education complete with patient and grandmother who was present in room regarding current deficits, safety awareness, and needs after D/C including 24 hour supervision and HH SLP tx f/u.   Assessment:  See above  Recommendations:  Plans to D/C today. Recommend HH SLP f/u for cognitive-linguistic therapy  Pain:   none Intervention Required:   No   Goals: progressing towards goals.   Ferdinand Lango MA, CCC-SLP [phone] Name change in 03/2011. Correct on license as Ferdinand Lango MA, CCC-SLP

## 2011-09-23 NOTE — Progress Notes (Signed)
Patient has expiratory wheezing in the right upper and lower lobes.  Encouraged incentive spirometer.   Patient returned demonstration.

## 2011-09-23 NOTE — Progress Notes (Signed)
Pt seen and examined.  Will d/c staples from LLE and discharge home.  Instructions given.

## 2011-09-23 NOTE — Progress Notes (Signed)
This patient was discussed at long LOS rounds 09/22/11.  
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
## 2011-09-23 NOTE — Progress Notes (Signed)
Agree with above.  Srihith Aquilino Tabor, PT [phone]  

## 2011-09-23 NOTE — Progress Notes (Signed)
Pt planned for d/c home today with mom's assistance. Needs HHPT/OT/SLP for cognition. Needs rolling walker but does not want the 3-n-1 chair--he says it is uncomfortable.   Home Address: [address] [place], [place] [address] Pt's cell phone: [phone] Mom's number: [phone].  Mom expressed concern about the services being precerted so they were covered at 100%.

## 2014-08-01 ENCOUNTER — Ambulatory Visit: Payer: Self-pay | Admitting: Sports Medicine

## 2014-08-08 ENCOUNTER — Ambulatory Visit (INDEPENDENT_AMBULATORY_CARE_PROVIDER_SITE_OTHER): Payer: BC Managed Care – PPO

## 2014-08-08 ENCOUNTER — Ambulatory Visit (INDEPENDENT_AMBULATORY_CARE_PROVIDER_SITE_OTHER): Payer: BC Managed Care – PPO | Admitting: Sports Medicine

## 2014-08-08 ENCOUNTER — Encounter: Payer: Self-pay | Admitting: Sports Medicine

## 2014-08-08 VITALS — BP 130/90 | HR 75 | Ht 69.5 in | Wt 245.0 lb

## 2014-08-08 DIAGNOSIS — M51369 Other intervertebral disc degeneration, lumbar region without mention of lumbar back pain or lower extremity pain: Secondary | ICD-10-CM | POA: Insufficient documentation

## 2014-08-08 DIAGNOSIS — Z0000 Encounter for general adult medical examination without abnormal findings: Secondary | ICD-10-CM | POA: Insufficient documentation

## 2014-08-08 DIAGNOSIS — S32011S Stable burst fracture of first lumbar vertebra, sequela: Secondary | ICD-10-CM | POA: Diagnosis not present

## 2014-08-08 DIAGNOSIS — M4856XS Collapsed vertebra, not elsewhere classified, lumbar region, sequela of fracture: Secondary | ICD-10-CM

## 2014-08-08 DIAGNOSIS — Z8659 Personal history of other mental and behavioral disorders: Secondary | ICD-10-CM | POA: Insufficient documentation

## 2014-08-08 DIAGNOSIS — M5136 Other intervertebral disc degeneration, lumbar region: Secondary | ICD-10-CM

## 2014-08-08 DIAGNOSIS — M2578 Osteophyte, vertebrae: Secondary | ICD-10-CM

## 2014-08-08 DIAGNOSIS — F419 Anxiety disorder, unspecified: Secondary | ICD-10-CM

## 2014-08-08 MED ORDER — PREDNISONE 50 MG PO TABS: ORAL_TABLET | ORAL | Status: DC

## 2014-08-08 MED ORDER — PREGABALIN 75 MG PO CAPS: 75.0000 mg | ORAL_CAPSULE | Freq: Two times a day (BID) | ORAL | Status: DC
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
## 2014-08-08 NOTE — Assessment & Plan Note (Signed)
We will discuss this in further detail at a future visit. 

## 2014-08-08 NOTE — Assessment & Plan Note (Signed)
One Valium prescribed by psychiatry.

## 2014-08-08 NOTE — Assessment & Plan Note (Addendum)
Persistent low back pain, sounds discogenic. Physical therapy, prednisone, Lyrica, x-rays. Considering history of narcotic use we will avoid narcotics in this patient. Return in 4 weeks, MRI if no better.

## 2014-09-09 ENCOUNTER — Encounter (HOSPITAL_COMMUNITY): Payer: Self-pay | Admitting: Emergency Medicine

## 2014-09-09 ENCOUNTER — Emergency Department (HOSPITAL_COMMUNITY)
Admission: EM | Admit: 2014-09-09 | Discharge: 2014-09-09 | Disposition: A | Discharge status: Home / Self Care | Payer: BC Managed Care – PPO | Attending: Emergency Medicine | Admitting: Emergency Medicine

## 2014-09-09 DIAGNOSIS — Z79899 Other long term (current) drug therapy: Secondary | ICD-10-CM | POA: Insufficient documentation

## 2014-09-09 DIAGNOSIS — G4730 Sleep apnea, unspecified: Secondary | ICD-10-CM | POA: Diagnosis not present

## 2014-09-09 DIAGNOSIS — I10 Essential (primary) hypertension: Secondary | ICD-10-CM | POA: Insufficient documentation

## 2014-09-09 DIAGNOSIS — Z791 Long term (current) use of non-steroidal anti-inflammatories (NSAID): Secondary | ICD-10-CM | POA: Insufficient documentation

## 2014-09-09 DIAGNOSIS — Z9981 Dependence on supplemental oxygen: Secondary | ICD-10-CM | POA: Diagnosis not present

## 2014-09-09 DIAGNOSIS — Z720 Tobacco use: Secondary | ICD-10-CM | POA: Diagnosis not present

## 2014-09-09 DIAGNOSIS — Z760 Encounter for issue of repeat prescription: Secondary | ICD-10-CM | POA: Insufficient documentation

## 2014-09-09 MED ORDER — LISINOPRIL 20 MG PO TABS: 20.0000 mg | ORAL_TABLET | Freq: Every day | ORAL | Status: DC

## 2014-09-09 NOTE — Discharge Instructions (Signed)

## 2014-09-09 NOTE — ED Notes (Signed)
Pt states he used to take lisinopril but has not been taking it for several months  Pt states he went to [facility] and his blood pressure is elevated  Pt states he takes pain medications for his chronic back pain

## 2014-09-09 NOTE — ED Notes (Signed)
Patient stated that he has not been able to see his primary doctor and he need his lisinopril. He stated he takes 20mg  of Lisinopril a day.
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
## 2014-12-09 ENCOUNTER — Other Ambulatory Visit: Payer: Self-pay

## 2014-12-09 DIAGNOSIS — M5136 Other intervertebral disc degeneration, lumbar region: Secondary | ICD-10-CM

## 2014-12-09 MED ORDER — PREGABALIN 75 MG PO CAPS: 75.0000 mg | ORAL_CAPSULE | Freq: Two times a day (BID) | ORAL | Status: DC

## 2017-11-17 ENCOUNTER — Encounter: Payer: Self-pay | Admitting: Sports Medicine

## 2017-11-17 ENCOUNTER — Ambulatory Visit: Payer: BLUE CROSS/BLUE SHIELD | Admitting: Sports Medicine

## 2017-11-17 DIAGNOSIS — Z8659 Personal history of other mental and behavioral disorders: Secondary | ICD-10-CM | POA: Diagnosis not present

## 2017-11-17 DIAGNOSIS — E291 Testicular hypofunction: Secondary | ICD-10-CM | POA: Diagnosis not present

## 2017-11-17 DIAGNOSIS — L918 Other hypertrophic disorders of the skin: Secondary | ICD-10-CM | POA: Diagnosis not present

## 2017-11-17 DIAGNOSIS — M5136 Other intervertebral disc degeneration, lumbar region: Secondary | ICD-10-CM | POA: Diagnosis not present

## 2017-11-17 DIAGNOSIS — Z0000 Encounter for general adult medical examination without abnormal findings: Secondary | ICD-10-CM | POA: Diagnosis not present

## 2017-11-17 DIAGNOSIS — M51369 Other intervertebral disc degeneration, lumbar region without mention of lumbar back pain or lower extremity pain: Secondary | ICD-10-CM

## 2017-11-17 MED ORDER — PHENTERMINE HCL 15 MG PO CAPS
15.0000 mg | ORAL_CAPSULE | ORAL | 0 refills | Status: DC
Start: 2017-11-17 — End: 2017-12-15

## 2017-11-17 NOTE — Assessment & Plan Note (Signed)
Checking testosterone levels, does have a history of anabolic steroid use.

## 2017-11-17 NOTE — Assessment & Plan Note (Signed)
Starting phentermine we are going to monitor on a monthly basis. He does have some concurrent mood disorder, fatigue, poor drive, this could represent a depressive component. We will keep an eye on this.
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
## 2017-11-17 NOTE — Assessment & Plan Note (Signed)
Cryotherapy x2 of skin tags on the back.

## 2017-11-17 NOTE — Assessment & Plan Note (Signed)
Starting low-dose phentermine with history of bipolar disorder we will monitor closely for mania. Return monthly for weight checks and refills, if he does plateau we can add Topamax.

## 2017-11-17 NOTE — Assessment & Plan Note (Signed)
I think weight loss will significantly help his back pain. We will keep an eye on this, he is having some left L5 distribution radiculopathy.

## 2017-11-17 NOTE — Assessment & Plan Note (Signed)
Checking routine labs 

## 2017-11-21 LAB — TESTOSTERONE, FREE & TOTAL
Free Testosterone: 54.3 pg/mL (ref 35.0–155.0)
Testosterone, Total, LC-MS-MS: 517 ng/dL (ref 250–1100)

## 2017-11-21 LAB — COMPREHENSIVE METABOLIC PANEL WITH GFR
ALT: 44 U/L (ref 9–46)
AST: 23 U/L (ref 10–40)
Alkaline phosphatase (APISO): 81 U/L (ref 40–115)
CO2: 27 mmol/L (ref 20–32)
Chloride: 102 mmol/L (ref 98–110)
Creat: 1.08 mg/dL (ref 0.60–1.35)
Globulin: 3.1 g/dL (ref 1.9–3.7)
Glucose, Bld: 86 mg/dL (ref 65–99)

## 2017-11-21 LAB — LIPID PANEL W/REFLEX DIRECT LDL
Cholesterol: 173 mg/dL (ref <200)
HDL: 40 mg/dL — ABNORMAL LOW (ref >40)
LDL Cholesterol (Calc): 114 mg/dL (calc) — ABNORMAL HIGH
Non-HDL Cholesterol (Calc): 133 mg/dL — ABNORMAL HIGH (ref <130)
Total CHOL/HDL Ratio: 4.3 (calc) (ref <5.0)
Triglycerides: 90 mg/dL (ref <150)

## 2017-11-21 LAB — COMPREHENSIVE METABOLIC PANEL
AG Ratio: 1.5 (calc) (ref 1.0–2.5)
Albumin: 4.5 g/dL (ref 3.6–5.1)
BUN: 19 mg/dL (ref 7–25)
Calcium: 9.7 mg/dL (ref 8.6–10.3)
Potassium: 4.2 mmol/L (ref 3.5–5.3)
Sodium: 138 mmol/L (ref 135–146)
Total Bilirubin: 0.6 mg/dL (ref 0.2–1.2)
Total Protein: 7.6 g/dL (ref 6.1–8.1)

## 2017-11-21 LAB — CBC
HCT: 48.4 % (ref 38.5–50.0)
Hemoglobin: 16.9 g/dL (ref 13.2–17.1)
MCH: 31.7 pg (ref 27.0–33.0)
MCHC: 34.9 g/dL (ref 32.0–36.0)
MCV: 90.8 fL (ref 80.0–100.0)
MPV: 10 fL (ref 7.5–12.5)
Platelets: 398 10*3/uL (ref 140–400)
RBC: 5.33 10*6/uL (ref 4.20–5.80)
RDW: 12.8 % (ref 11.0–15.0)
WBC: 9.2 10*3/uL (ref 3.8–10.8)

## 2017-11-21 LAB — HEMOGLOBIN A1C
Hgb A1c MFr Bld: 5.2 %{Hb} (ref <5.7)
Mean Plasma Glucose: 103 (calc)
eAG (mmol/L): 5.7 (calc)

## 2017-11-21 LAB — TSH: TSH: 1.71 m[IU]/L (ref 0.40–4.50)

## 2017-11-21 LAB — VITAMIN D 25 HYDROXY (VIT D DEFICIENCY, FRACTURES): Vit D, 25-Hydroxy: 36 ng/mL (ref 30–100)

## 2017-11-21 LAB — PSA, TOTAL AND FREE
PSA, % Free: 25 % — ABNORMAL LOW (ref >25)
PSA, Free: 0.1 ng/mL
PSA, Total: 0.4 ng/mL (ref <=4.0)

## 2017-12-15 ENCOUNTER — Encounter: Payer: Self-pay | Admitting: Sports Medicine

## 2017-12-15 ENCOUNTER — Ambulatory Visit: Payer: BLUE CROSS/BLUE SHIELD | Admitting: Sports Medicine

## 2017-12-15 MED ORDER — PHENTERMINE HCL 37.5 MG PO TABS: ORAL_TABLET | ORAL | 0 refills | Status: DC

## 2017-12-15 NOTE — Assessment & Plan Note (Signed)
Doing well, refilling phentermine at this time going to a full dose. No evidence of mania emerging. Return in 1 month, we are entering the second month.
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
## 2018-01-12 ENCOUNTER — Ambulatory Visit: Payer: BLUE CROSS/BLUE SHIELD | Admitting: Sports Medicine

## 2018-01-12 ENCOUNTER — Encounter: Payer: Self-pay | Admitting: Sports Medicine

## 2018-01-12 MED ORDER — PHENTERMINE HCL 37.5 MG PO TABS: ORAL_TABLET | ORAL | 0 refills | Status: DC

## 2018-01-12 NOTE — Assessment & Plan Note (Signed)
Only 2 additional pound weight loss, entering the third month. He is still taking a half tab, no evidence of emergence of mania or hypomania, he is going to go up to one half tab twice a day. Return in a month. We can certainly still add Topamax or acarbose should his weight loss plateau.
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
## 2018-02-09 ENCOUNTER — Encounter: Payer: Self-pay | Admitting: Sports Medicine

## 2018-02-09 ENCOUNTER — Other Ambulatory Visit: Payer: Self-pay | Admitting: Chiropractor

## 2018-02-09 ENCOUNTER — Ambulatory Visit: Payer: BLUE CROSS/BLUE SHIELD | Admitting: Sports Medicine

## 2018-02-09 DIAGNOSIS — S060X9A Concussion with loss of consciousness of unspecified duration, initial encounter: Secondary | ICD-10-CM

## 2018-02-09 MED ORDER — ACARBOSE 50 MG PO TABS: 50.0000 mg | ORAL_TABLET | Freq: Three times a day (TID) | ORAL | 3 refills | Status: AC

## 2018-02-09 MED ORDER — PHENTERMINE HCL 37.5 MG PO TABS: ORAL_TABLET | ORAL | 0 refills | Status: DC
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
## 2018-02-09 NOTE — Assessment & Plan Note (Signed)
Additional 3 pound weight loss as we entered the fourth month, adding acarbose. If insufficient improvement we will add Topamax as well. Currently doing 1 full tab of phentermine daily.

## 2018-02-15 ENCOUNTER — Other Ambulatory Visit: Payer: Self-pay

## 2018-02-18 ENCOUNTER — Other Ambulatory Visit: Payer: Self-pay

## 2018-02-19 ENCOUNTER — Ambulatory Visit
Admission: RE | Admit: 2018-02-19 | Discharge: 2018-02-19 | Disposition: A | Discharge status: Home / Self Care | Payer: No Typology Code available for payment source | Source: Ambulatory Visit | Attending: Chiropractor | Admitting: Chiropractor

## 2018-02-19 DIAGNOSIS — S060X9A Concussion with loss of consciousness of unspecified duration, initial encounter: Secondary | ICD-10-CM

## 2018-03-06 ENCOUNTER — Encounter: Payer: Self-pay | Admitting: Sports Medicine

## 2018-03-06 ENCOUNTER — Ambulatory Visit: Payer: BLUE CROSS/BLUE SHIELD | Admitting: Sports Medicine

## 2018-03-06 MED ORDER — PHENTERMINE HCL 37.5 MG PO TABS: ORAL_TABLET | ORAL | 0 refills | Status: AC

## 2018-03-06 NOTE — Assessment & Plan Note (Signed)
During the fifth month, weight has remained about the same, may be a pound weight loss. We added acarbose at the last visit, excessive flatulence so uses it only occasionally. Refilling phentermine, we will use Topamax at the next visit if weight continues to remain the same. He is starting to get back into anabolic steroids, and we should expect some weight gain, cautioned about risks.
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
## 2018-04-04 ENCOUNTER — Ambulatory Visit: Payer: Self-pay | Admitting: Sports Medicine

## 2018-04-07 ENCOUNTER — Ambulatory Visit: Payer: Self-pay | Admitting: Sports Medicine

## 2018-04-13 ENCOUNTER — Telehealth: Payer: Self-pay

## 2018-04-13 DIAGNOSIS — M51369 Other intervertebral disc degeneration, lumbar region without mention of lumbar back pain or lower extremity pain: Secondary | ICD-10-CM

## 2018-04-13 DIAGNOSIS — M5136 Other intervertebral disc degeneration, lumbar region: Secondary | ICD-10-CM

## 2018-04-13 MED ORDER — PREGABALIN 75 MG PO CAPS: 75.0000 mg | ORAL_CAPSULE | Freq: Two times a day (BID) | ORAL | 3 refills | Status: AC

## 2018-04-13 MED ORDER — PREDNISONE 50 MG PO TABS: ORAL_TABLET | ORAL | 0 refills | Status: AC

## 2018-04-13 NOTE — Telephone Encounter (Signed)
Patient advised of recommendations.  

## 2018-04-13 NOTE — Telephone Encounter (Signed)
Adding 5 days of prednisone, refilling Lyrica.

## 2018-04-13 NOTE — Telephone Encounter (Signed)
Willie Reid complains of a flare up for 3 weeks of low back pain. He states he has scheduled appointment for Monday. He was wanting a refill on Lyrica. He has been taking Naproxen and alternating with Ibuprofen. Please advise.

## 2018-04-17 ENCOUNTER — Ambulatory Visit: Payer: Self-pay | Admitting: Sports Medicine

## 2018-07-31 ENCOUNTER — Telehealth: Payer: Self-pay

## 2018-07-31 MED ORDER — VALACYCLOVIR HCL 1 G PO TABS: 1000.0000 mg | ORAL_TABLET | Freq: Two times a day (BID) | ORAL | 2 refills | Status: DC

## 2018-07-31 NOTE — Telephone Encounter (Signed)
Pt advised.

## 2018-07-31 NOTE — Telephone Encounter (Signed)
Done, with refills

## 2018-07-31 NOTE — Telephone Encounter (Signed)
Pt calls, reports he has had a cold sore for several weeks. Has tried several topicals and they will not go away.  Requesting RX for Valtrex

## 2018-08-10 ENCOUNTER — Other Ambulatory Visit: Payer: Self-pay

## 2018-08-10 ENCOUNTER — Emergency Department
Admission: EM | Admit: 2018-08-10 | Discharge: 2018-08-10 | Disposition: A | Discharge status: Home / Self Care | Payer: BLUE CROSS/BLUE SHIELD | Source: Home / Self Care | Attending: Family Medicine | Admitting: Family Medicine

## 2018-08-10 DIAGNOSIS — R609 Edema, unspecified: Secondary | ICD-10-CM | POA: Diagnosis not present

## 2018-08-10 MED ORDER — FUROSEMIDE 20 MG PO TABS: ORAL_TABLET | ORAL | 0 refills | Status: AC

## 2018-08-10 NOTE — Discharge Instructions (Addendum)
Weigh daily and record. Recommend wearing graduated compression support hose during the day.

## 2018-08-10 NOTE — ED Triage Notes (Signed)
Pt c/o leg swelling x 3-4 months. Notices it worsens when he takes testosterone boosters. Take an herbal diuretic every other day.
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
## 2018-09-29 ENCOUNTER — Other Ambulatory Visit: Payer: Self-pay

## 2018-09-29 MED ORDER — VALACYCLOVIR HCL 1 G PO TABS: 1000.0000 mg | ORAL_TABLET | Freq: Two times a day (BID) | ORAL | 2 refills | Status: AC

## 2018-09-29 NOTE — Telephone Encounter (Signed)
Requesting a refill on Valtrex.
